# Patient Record
Sex: Female | Born: 1955
Health system: Southern US, Community
[De-identification: ages and names within clinical notes are randomized; demographics above are authoritative.]

## PROBLEM LIST (undated history)

## (undated) DIAGNOSIS — E785 Hyperlipidemia, unspecified: Secondary | ICD-10-CM

## (undated) DIAGNOSIS — G4730 Sleep apnea, unspecified: Secondary | ICD-10-CM

## (undated) DIAGNOSIS — G4733 Obstructive sleep apnea (adult) (pediatric): Secondary | ICD-10-CM

## (undated) DIAGNOSIS — E039 Hypothyroidism, unspecified: Secondary | ICD-10-CM

## (undated) DIAGNOSIS — E119 Type 2 diabetes mellitus without complications: Secondary | ICD-10-CM

## (undated) DIAGNOSIS — F32A Depression, unspecified: Secondary | ICD-10-CM

## (undated) DIAGNOSIS — I10 Essential (primary) hypertension: Secondary | ICD-10-CM

## (undated) DIAGNOSIS — Z8669 Personal history of other diseases of the nervous system and sense organs: Secondary | ICD-10-CM

## (undated) HISTORY — DX: Obstructive sleep apnea (adult) (pediatric): G47.33

## (undated) HISTORY — DX: Hyperlipidemia, unspecified: E78.5

## (undated) HISTORY — PX: FOOT SURGERY: SHX648

## (undated) HISTORY — DX: Depression, unspecified: F32.A

## (undated) HISTORY — DX: Type 2 diabetes mellitus without complications: E11.9

## (undated) HISTORY — PX: BREAST SURGERY: SHX581

## (undated) HISTORY — DX: Personal history of other diseases of the nervous system and sense organs: Z86.69

## (undated) HISTORY — PX: OTHER SURGICAL HISTORY: SHX169

## (undated) HISTORY — DX: Essential (primary) hypertension: I10

## (undated) HISTORY — DX: Hypothyroidism, unspecified: E03.9

## (undated) HISTORY — PX: TONSILLECTOMY AND ADENOIDECTOMY: SUR1326

## (undated) HISTORY — DX: Sleep apnea, unspecified: G47.30

## (undated) HISTORY — PX: ABDOMINAL HYSTERECTOMY: SHX81

---

## 2015-07-05 DIAGNOSIS — E785 Hyperlipidemia, unspecified: Secondary | ICD-10-CM | POA: Insufficient documentation

## 2015-07-05 DIAGNOSIS — E1169 Type 2 diabetes mellitus with other specified complication: Secondary | ICD-10-CM | POA: Insufficient documentation

## 2016-06-26 DIAGNOSIS — F320 Major depressive disorder, single episode, mild: Secondary | ICD-10-CM | POA: Insufficient documentation

## 2016-06-26 DIAGNOSIS — F32A Depression, unspecified: Secondary | ICD-10-CM | POA: Insufficient documentation

## 2019-11-29 ENCOUNTER — Ambulatory Visit: Payer: Self-pay

## 2020-03-18 ENCOUNTER — Telehealth: Payer: Self-pay | Admitting: General Practice

## 2020-03-18 NOTE — Telephone Encounter (Signed)
See staff msg.

## 2020-03-18 NOTE — Telephone Encounter (Signed)
Tonya Greer called stated that her sister [name] who is a patient of Dr.Scott spoke with Dr.Scott about taking her as anew patient

## 2020-03-18 NOTE — Telephone Encounter (Signed)
ok 

## 2020-03-25 NOTE — Telephone Encounter (Signed)
Pt wants husband to be new pt as well. States that sister was supposed to include him as well?

## 2020-03-27 NOTE — Telephone Encounter (Signed)
ok 

## 2020-03-27 NOTE — Telephone Encounter (Signed)
Ok

## 2020-03-29 NOTE — Telephone Encounter (Signed)
LMTCB and schedule new pt appt for spouse

## 2020-07-07 ENCOUNTER — Encounter: Payer: Self-pay | Admitting: Internal Medicine

## 2020-07-07 ENCOUNTER — Telehealth: Payer: Self-pay | Admitting: Internal Medicine

## 2020-07-07 ENCOUNTER — Other Ambulatory Visit: Payer: Self-pay

## 2020-07-07 ENCOUNTER — Telehealth (INDEPENDENT_AMBULATORY_CARE_PROVIDER_SITE_OTHER): Payer: Medicare HMO | Admitting: Internal Medicine

## 2020-07-07 DIAGNOSIS — E039 Hypothyroidism, unspecified: Secondary | ICD-10-CM | POA: Diagnosis not present

## 2020-07-07 DIAGNOSIS — R252 Cramp and spasm: Secondary | ICD-10-CM

## 2020-07-07 DIAGNOSIS — F329 Major depressive disorder, single episode, unspecified: Secondary | ICD-10-CM | POA: Diagnosis not present

## 2020-07-07 DIAGNOSIS — E785 Hyperlipidemia, unspecified: Secondary | ICD-10-CM

## 2020-07-07 DIAGNOSIS — F32A Depression, unspecified: Secondary | ICD-10-CM

## 2020-07-07 DIAGNOSIS — R0602 Shortness of breath: Secondary | ICD-10-CM

## 2020-07-07 DIAGNOSIS — E1169 Type 2 diabetes mellitus with other specified complication: Secondary | ICD-10-CM | POA: Diagnosis not present

## 2020-07-07 DIAGNOSIS — G4733 Obstructive sleep apnea (adult) (pediatric): Secondary | ICD-10-CM

## 2020-07-07 MED ORDER — METFORMIN HCL 500 MG PO TABS
500.0000 mg | ORAL_TABLET | Freq: Every day | ORAL | 0 refills | Status: DC
Start: 2020-07-07 — End: 2020-09-29

## 2020-07-07 MED ORDER — SIMVASTATIN 20 MG PO TABS
20.0000 mg | ORAL_TABLET | Freq: Every day | ORAL | 0 refills | Status: DC
Start: 2020-07-07 — End: 2020-08-04

## 2020-07-07 MED ORDER — SERTRALINE HCL 50 MG PO TABS
50.0000 mg | ORAL_TABLET | Freq: Two times a day (BID) | ORAL | 0 refills | Status: DC
Start: 2020-07-07 — End: 2020-09-29

## 2020-07-07 MED ORDER — LISINOPRIL-HYDROCHLOROTHIAZIDE 20-25 MG PO TABS
1.0000 | ORAL_TABLET | Freq: Every day | ORAL | 0 refills | Status: DC
Start: 2020-07-07 — End: 2020-08-20

## 2020-07-07 NOTE — Progress Notes (Deleted)
Patient ID: Tonya Greer, female   DOB: [birth year], 64 y.o.   MRN: [id number]   Subjective:    Patient ID: Tonya Greer, female    DOB: Feb 08, 1956, 64 y.o.   MRN: [id number]  HPI This visit occurred during the SARS-CoV-2 public health emergency.  Safety protocols were in place, including screening questions prior to the visit, additional usage of staff PPE, and extensive cleaning of exam room while observing appropriate contact time as indicated for disinfecting solutions.  Patient here for a  No past medical history on file. *** The histories are not reviewed yet. Please review them in the "History" navigator section and refresh this [place]. No family history on file. Social History   Socioeconomic History  . Marital status: Unknown    Spouse name: Not on file  . Number of children: Not on file  . Years of education: Not on file  . Highest education level: Not on file  Occupational History  . Not on file  Tobacco Use  . Smoking status: Not on file  Substance and Sexual Activity  . Alcohol use: Not on file  . Drug use: Not on file  . Sexual activity: Not on file  Other Topics Concern  . Not on file  Social History Narrative  . Not on file   Social Determinants of Health   Financial Resource Strain:   . Difficulty of Paying Living Expenses: Not on file  Food Insecurity:   . Worried About Charity fundraiser in the Last Year: Not on file  . Ran Out of Food in the Last Year: Not on file  Transportation Needs:   . Lack of Transportation (Medical): Not on file  . Lack of Transportation (Non-Medical): Not on file  Physical Activity:   . Days of Exercise per Week: Not on file  . Minutes of Exercise per Session: Not on file  Stress:   . Feeling of Stress : Not on file  Social Connections:   . Frequency of Communication with Friends and Family: Not on file  . Frequency of Social Gatherings with Friends and Family: Not on file  . Attends Religious Services: Not on file    . Active Member of Clubs or Organizations: Not on file  . Attends Archivist Meetings: Not on file  . Marital Status: Not on file     Review of Systems     Objective:    Physical Exam  Ht 5\' 2"  (1.575 m)   Wt 201 lb (91.2 kg)   BMI 36.76 kg/m  Wt Readings from Last 3 Encounters:  07/07/20 201 lb (91.2 kg)     No results found for: WBC, HGB, HCT, PLT, GLUCOSE, CHOL, TRIG, HDL, LDLDIRECT, LDLCALC, ALT, AST, NA, K, CL, CREATININE, BUN, CO2, TSH, PSA, INR, GLUF, HGBA1C, MICROALBUR  Patient was never admitted.     Assessment & Plan:   Problem List Items Addressed This Visit    None       Einar Pheasant, MD

## 2020-07-07 NOTE — Telephone Encounter (Signed)
Had an establish care appt today.  I have sent in her medications.  The only one I did not send in is synthroid.  She was asking me about generic synthroid.  Does she want to try the generic and see how she does?  (cost was an issue).  Just let me know.  Also, she will need to come in for fasting lab.  Can schedule here when able.  I was not sure of time frame of when she could come in.  I will put in order for labs, just needs lab appt time.

## 2020-07-07 NOTE — Telephone Encounter (Signed)
LMTCB

## 2020-07-08 ENCOUNTER — Encounter: Payer: Self-pay | Admitting: Internal Medicine

## 2020-07-09 ENCOUNTER — Other Ambulatory Visit: Payer: Self-pay

## 2020-07-09 LAB — HM MAMMOGRAPHY

## 2020-07-09 MED ORDER — FLOVENT HFA 110 MCG/ACT IN AERO: 1.0000 | INHALATION_SPRAY | Freq: Two times a day (BID) | RESPIRATORY_TRACT | 0 refills | Status: DC

## 2020-07-09 NOTE — Telephone Encounter (Signed)
Rx sent in

## 2020-07-09 NOTE — Telephone Encounter (Signed)
Patient called in about MyChart message. Larena Glassman is aware and is taking care of it.

## 2020-07-09 NOTE — Telephone Encounter (Signed)
Ok to send in rx for inhaler.

## 2020-07-15 NOTE — Telephone Encounter (Signed)
Orders placed for labs

## 2020-07-15 NOTE — Addendum Note (Signed)
Addended by: Alisa Graff on: 07/15/2020 01:41 PM   Modules accepted: Orders

## 2020-07-15 NOTE — Telephone Encounter (Signed)
Patient is okay to try the generic. She has enough to last her through October. I have scheduled her for fasting labs on 07/28/20 and advised that we would send in medication after labs come back. Do you just want to order routine labs for her. I do not mind ordering just wanted to confirm with you.

## 2020-07-15 NOTE — Telephone Encounter (Signed)
Noted  

## 2020-07-18 ENCOUNTER — Telehealth: Payer: Self-pay | Admitting: Internal Medicine

## 2020-07-18 ENCOUNTER — Encounter: Payer: Self-pay | Admitting: Internal Medicine

## 2020-07-18 DIAGNOSIS — R0602 Shortness of breath: Secondary | ICD-10-CM | POA: Insufficient documentation

## 2020-07-18 DIAGNOSIS — E039 Hypothyroidism, unspecified: Secondary | ICD-10-CM | POA: Insufficient documentation

## 2020-07-18 DIAGNOSIS — G4733 Obstructive sleep apnea (adult) (pediatric): Secondary | ICD-10-CM | POA: Insufficient documentation

## 2020-07-18 DIAGNOSIS — R252 Cramp and spasm: Secondary | ICD-10-CM | POA: Insufficient documentation

## 2020-07-18 NOTE — Telephone Encounter (Signed)
Please schedule fasting labs in the next 1-2 weeks (when she can come into the office).  Traveled to [place] recently.

## 2020-07-18 NOTE — Assessment & Plan Note (Signed)
On zoloft and appears to be doing well.  Follow.

## 2020-07-18 NOTE — Assessment & Plan Note (Addendum)
Last a1c 6.6 - 03/2020.  Discussed low carb diet and exercise.  Follow met b and a1c.  On metformin.

## 2020-07-18 NOTE — Progress Notes (Signed)
Patient ID: Tonya Greer, female   DOB: Mar 27, 1956, 64 y.o.   MRN: [id number]   Virtual Visit via video Note  This visit type was conducted due to national recommendations for restrictions regarding the COVID-19 pandemic (e.g. social distancing).  This format is felt to be most appropriate for this patient at this time.  All issues noted in this document were discussed and addressed.  No physical exam was performed (except for noted visual exam findings with Video Visits).   I connected with Tonya Greer by a video enabled telemedicine application and verified that I am speaking with the correct person using two identifiers. Location patient: home Location provider: work Persons participating in the virtual visit: patient, provider  The limitations, risks, security and privacy concerns of performing an evaluation and management service by video and the availability of in person appointments have been discussed.  It has also been discussed with the patient that there may be a patient responsible charge related to this service. The patient expressed understanding and agreed to proceed.   Reason for visit: establish care appt  HPI: Here to establish care.  Moved to [place] within the last year.  Has a history of depression, hypothyroidism, sleep apnea and diabetes.  Tries to stay active.  Has seen cardiologist previously.  Mother had heart disease.  Had elevated calcium score - per note. Had previous cardiac w/up and evaluation.  S/p holter with no concerning rhythm issue.  No chest pain.  Does report some sob with exertion.  Has sleep apnea.  Request reevaluation.  No acid reflux reported.  No abdominal pain.  Bowels moving.  Recently saw Tonya Greer for her thyroid.  Had labs.  Reviewed.  Has known diabetes.  Discussed low carb diet and exercise. a1c 6.6 -  03/2020.   States she has gained 20 pounds in the last two years.  Had two foot surgeries.  Recently traveled to [place].  On zoloft.  History of  depression.  Appears to be doing well on this medication.  Reports some leg cramps.  Also has a history of CTS - bilateral.  States has mammogram and bone density scheduled.  Last colonoscopy 08/2019.     ROS: See pertinent positives and negatives per HPI.  Past Medical History:  Diagnosis Date  . Depression   . Diabetes mellitus without complication ([place])   . History of carpal tunnel syndrome   . Hyperlipidemia   . Hypertension   . Hypothyroidism   . OSA (obstructive sleep apnea)     Past Surgical History:  Procedure Laterality Date  . ABDOMINAL HYSTERECTOMY     ovaries left in place  . brreast biopsy    . FOOT SURGERY    . TONSILLECTOMY AND ADENOIDECTOMY      Family History  Problem Relation Age of Onset  . Heart disease Mother     SOCIAL HX: reviewed.    Current Outpatient Medications:  .  lisinopril-hydrochlorothiazide (ZESTORETIC) 20-25 MG tablet, Take 1 tablet by mouth daily., Disp: 90 tablet, Rfl: 0 .  metFORMIN (GLUCOPHAGE) 500 MG tablet, Take 1 tablet (500 mg total) by mouth daily., Disp: 90 tablet, Rfl: 0 .  sertraline (ZOLOFT) 50 MG tablet, Take 1 tablet (50 mg total) by mouth in the morning and at bedtime., Disp: 180 tablet, Rfl: 0 .  simvastatin (ZOCOR) 20 MG tablet, Take 1 tablet (20 mg total) by mouth at bedtime., Disp: 90 tablet, Rfl: 0 .  aspirin 81 MG chewable tablet, Chew 1 tablet by  mouth daily., Disp: , Rfl:  .  FLOVENT HFA 110 MCG/ACT inhaler, Inhale 1 puff into the lungs 2 (two) times daily., Disp: 1 each, Rfl: 0 .  SYNTHROID 100 MCG tablet, Take 100 mcg by mouth daily., Disp: , Rfl:   EXAM:  GENERAL: alert, oriented, appears well and in no acute distress  HEENT: atraumatic, conjunttiva clear, no obvious abnormalities on inspection of external nose and ears  NECK: normal movements of the head and neck  LUNGS: on inspection no signs of respiratory distress, breathing rate appears normal, no obvious gross SOB, gasping or wheezing  CV: no  obvious cyanosis  PSYCH/NEURO: pleasant and cooperative, no obvious depression or anxiety, speech and thought processing grossly intact  ASSESSMENT AND PLAN:  Discussed the following assessment and plan:  DM type 2 with diabetic dyslipidemia (HCC) Last a1c 6.6 - 03/2020.  Discussed low carb diet and exercise.  Follow met b and a1c.  On metformin.    Chronic depression On zoloft and appears to be doing well.  Follow.    Hypothyroid On synthroid.  States synthroid is expensive.  Was questioning if she could try generic.  Discussed would need to monitor levels.  Follow tsh.  Will notify me when needs.  Has seen Tonya  Gabriel Greer.   OSA (obstructive sleep apnea) Uses cpap.  Request reevaluation.    SOB (shortness of breath) Reports some sob with exertion.  Has seen cardiology and had previous cardiac w/up.  Uses flovent.  No increased cough or congestion. Known sleep apnea.  Has gained 20 pounds in the last two years. Discussed diet and exercise.  Pulmonary referral as outlined.  Follow.    Leg cramps Did report having issues with leg cramps.  Stretches.  Check electrolytes, magnesium, etc.     Orders Placed This Encounter  Procedures  . CBC with Differential/Platelet    Standing Status:   Future    Standing Expiration Date:   07/18/2021  . Hepatic function panel    Standing Status:   Future    Standing Expiration Date:   07/18/2021  . Hemoglobin A1c    Standing Status:   Future    Standing Expiration Date:   07/18/2021  . Lipid panel    Standing Status:   Future    Standing Expiration Date:   07/18/2021  . TSH    Standing Status:   Future    Standing Expiration Date:   07/18/2021  . Basic metabolic panel    Standing Status:   Future    Standing Expiration Date:   07/18/2021  . Magnesium    Standing Status:   Future    Standing Expiration Date:   07/18/2021  . Ambulatory referral to Pulmonology    Referral Priority:   Routine    Referral Type:   Consultation    Referral Reason:    Specialty Services Required    Requested Specialty:   Pulmonary Disease    Number of Visits Requested:   1    Meds ordered this encounter  Medications  . metFORMIN (GLUCOPHAGE) 500 MG tablet    Sig: Take 1 tablet (500 mg total) by mouth daily.    Dispense:  90 tablet    Refill:  0  . sertraline (ZOLOFT) 50 MG tablet    Sig: Take 1 tablet (50 mg total) by mouth in the morning and at bedtime.    Dispense:  180 tablet    Refill:  0  . lisinopril-hydrochlorothiazide (ZESTORETIC) 20-25 MG tablet  Sig: Take 1 tablet by mouth daily.    Dispense:  90 tablet    Refill:  0  . simvastatin (ZOCOR) 20 MG tablet    Sig: Take 1 tablet (20 mg total) by mouth at bedtime.    Dispense:  90 tablet    Refill:  0     I discussed the assessment and treatment plan with the patient. The patient was provided an opportunity to ask questions and all were answered. The patient agreed with the plan and demonstrated an understanding of the instructions.   The patient was advised to call back or seek an in-person evaluation if the symptoms worsen or if the condition fails to improve as anticipated.   Einar Pheasant, MD

## 2020-07-18 NOTE — Assessment & Plan Note (Signed)
Uses cpap.  Request reevaluation.

## 2020-07-18 NOTE — Assessment & Plan Note (Addendum)
On synthroid.  States synthroid is expensive.  Was questioning if she could try generic.  Discussed would need to monitor levels.  Follow tsh.  Will notify me when needs.  Has seen Dr  Gabriel Carina.

## 2020-07-18 NOTE — Assessment & Plan Note (Signed)
Did report having issues with leg cramps.  Stretches.  Check electrolytes, magnesium, etc.

## 2020-07-18 NOTE — Assessment & Plan Note (Signed)
Reports some sob with exertion.  Has seen cardiology and had previous cardiac w/up.  Uses flovent.  No increased cough or congestion. Known sleep apnea.  Has gained 20 pounds in the last two years. Discussed diet and exercise.  Pulmonary referral as outlined.  Follow.

## 2020-07-20 NOTE — Telephone Encounter (Signed)
Lab appt already scheduled

## 2020-07-28 ENCOUNTER — Other Ambulatory Visit: Payer: Medicare HMO

## 2020-07-30 ENCOUNTER — Other Ambulatory Visit: Payer: Self-pay

## 2020-07-30 ENCOUNTER — Other Ambulatory Visit (INDEPENDENT_AMBULATORY_CARE_PROVIDER_SITE_OTHER): Payer: Medicare HMO

## 2020-07-30 DIAGNOSIS — E1169 Type 2 diabetes mellitus with other specified complication: Secondary | ICD-10-CM | POA: Diagnosis not present

## 2020-07-30 DIAGNOSIS — E785 Hyperlipidemia, unspecified: Secondary | ICD-10-CM

## 2020-07-30 DIAGNOSIS — E039 Hypothyroidism, unspecified: Secondary | ICD-10-CM

## 2020-07-30 DIAGNOSIS — Z23 Encounter for immunization: Secondary | ICD-10-CM | POA: Diagnosis not present

## 2020-07-30 DIAGNOSIS — R252 Cramp and spasm: Secondary | ICD-10-CM | POA: Diagnosis not present

## 2020-07-30 LAB — LIPID PANEL
Cholesterol: 187 mg/dL (ref 0–200)
HDL: 56.7 mg/dL (ref >39.00)
LDL Cholesterol: 102 mg/dL — ABNORMAL HIGH (ref 0–99)
NonHDL: 130.49
Total CHOL/HDL Ratio: 3
Triglycerides: 143 mg/dL (ref 0.0–149.0)
VLDL: 28.6 mg/dL (ref 0.0–40.0)

## 2020-07-30 LAB — TSH: TSH: 6.2 u[IU]/mL — ABNORMAL HIGH (ref 0.35–4.50)

## 2020-07-30 LAB — CBC WITH DIFFERENTIAL/PLATELET
Basophils Absolute: 0.1 10*3/uL (ref 0.0–0.1)
Basophils Relative: 0.9 % (ref 0.0–3.0)
Eosinophils Absolute: 0.3 10*3/uL (ref 0.0–0.7)
Eosinophils Relative: 3.4 % (ref 0.0–5.0)
HCT: 37.7 % (ref 36.0–46.0)
Hemoglobin: 12.4 g/dL (ref 12.0–15.0)
Lymphocytes Relative: 17.6 % (ref 12.0–46.0)
Lymphs Abs: 1.4 10*3/uL (ref 0.7–4.0)
MCHC: 33 g/dL (ref 30.0–36.0)
MCV: 95 fl (ref 78.0–100.0)
Monocytes Absolute: 0.4 10*3/uL (ref 0.1–1.0)
Monocytes Relative: 5.2 % (ref 3.0–12.0)
Neutro Abs: 5.8 10*3/uL (ref 1.4–7.7)
Neutrophils Relative %: 72.9 % (ref 43.0–77.0)
Platelets: 188 10*3/uL (ref 150.0–400.0)
RBC: 3.96 Mil/uL (ref 3.87–5.11)
RDW: 14.7 % (ref 11.5–15.5)
WBC: 7.9 10*3/uL (ref 4.0–10.5)

## 2020-07-30 LAB — BASIC METABOLIC PANEL
BUN: 18 mg/dL (ref 6–23)
CO2: 29 mEq/L (ref 19–32)
Calcium: 9 mg/dL (ref 8.4–10.5)
Chloride: 103 mEq/L (ref 96–112)
Creatinine, Ser: 1.14 mg/dL (ref 0.40–1.20)
GFR: 50.29 mL/min — ABNORMAL LOW (ref >60.00)
Glucose, Bld: 102 mg/dL — ABNORMAL HIGH (ref 70–99)
Potassium: 3.8 mEq/L (ref 3.5–5.1)
Sodium: 140 mEq/L (ref 135–145)

## 2020-07-30 LAB — HEPATIC FUNCTION PANEL
ALT: 16 U/L (ref 0–35)
AST: 16 U/L (ref 0–37)
Albumin: 4.5 g/dL (ref 3.5–5.2)
Alkaline Phosphatase: 55 U/L (ref 39–117)
Bilirubin, Direct: 0.1 mg/dL (ref 0.0–0.3)
Total Bilirubin: 0.5 mg/dL (ref 0.2–1.2)
Total Protein: 7 g/dL (ref 6.0–8.3)

## 2020-07-30 LAB — MAGNESIUM: Magnesium: 2 mg/dL (ref 1.5–2.5)

## 2020-07-30 LAB — HEMOGLOBIN A1C: Hgb A1c MFr Bld: 6.7 % — ABNORMAL HIGH (ref 4.6–6.5)

## 2020-08-04 ENCOUNTER — Other Ambulatory Visit: Payer: Self-pay

## 2020-08-04 MED ORDER — ROSUVASTATIN CALCIUM 20 MG PO TABS: 20.0000 mg | ORAL_TABLET | Freq: Every day | ORAL | 1 refills | Status: DC

## 2020-08-20 ENCOUNTER — Other Ambulatory Visit: Payer: Self-pay | Admitting: Internal Medicine

## 2020-09-08 ENCOUNTER — Encounter: Payer: Self-pay | Admitting: Internal Medicine

## 2020-09-08 ENCOUNTER — Other Ambulatory Visit: Payer: Self-pay | Admitting: Internal Medicine

## 2020-09-08 MED ORDER — SYNTHROID 100 MCG PO TABS
100.0000 ug | ORAL_TABLET | Freq: Every day | ORAL | 1 refills | Status: DC
Start: 2020-09-08 — End: 2020-09-30

## 2020-09-08 NOTE — Telephone Encounter (Signed)
rx sent in for synthroid 179mcg #30 with 1 refill.

## 2020-09-08 NOTE — Telephone Encounter (Signed)
Last TSH 07/30/20 ok to reorder historical provider levothyroxine 100 mcg.

## 2020-09-27 ENCOUNTER — Encounter: Payer: Medicare HMO | Admitting: Internal Medicine

## 2020-09-29 ENCOUNTER — Other Ambulatory Visit: Payer: Self-pay | Admitting: Internal Medicine

## 2020-09-29 ENCOUNTER — Encounter: Payer: Self-pay | Admitting: Internal Medicine

## 2020-09-30 ENCOUNTER — Other Ambulatory Visit: Payer: Self-pay

## 2020-09-30 MED ORDER — SERTRALINE HCL 50 MG PO TABS
50.0000 mg | ORAL_TABLET | Freq: Two times a day (BID) | ORAL | 1 refills | Status: DC
Start: 2020-09-30 — End: 2020-12-21

## 2020-09-30 MED ORDER — SYNTHROID 100 MCG PO TABS: 100.0000 ug | ORAL_TABLET | Freq: Every day | ORAL | 1 refills | Status: DC

## 2020-09-30 MED ORDER — METFORMIN HCL 500 MG PO TABS
500.0000 mg | ORAL_TABLET | Freq: Every day | ORAL | 1 refills | Status: DC
Start: 2020-09-30 — End: 2020-12-29

## 2020-09-30 MED ORDER — ROSUVASTATIN CALCIUM 20 MG PO TABS
20.0000 mg | ORAL_TABLET | Freq: Every day | ORAL | 1 refills | Status: DC
Start: 2020-09-30 — End: 2020-10-04

## 2020-10-02 ENCOUNTER — Other Ambulatory Visit: Payer: Self-pay | Admitting: Internal Medicine

## 2020-10-18 DIAGNOSIS — C50412 Malignant neoplasm of upper-outer quadrant of left female breast: Secondary | ICD-10-CM | POA: Diagnosis not present

## 2020-10-18 DIAGNOSIS — Z170 Estrogen receptor positive status [ER+]: Secondary | ICD-10-CM | POA: Diagnosis not present

## 2020-10-25 DIAGNOSIS — C50412 Malignant neoplasm of upper-outer quadrant of left female breast: Secondary | ICD-10-CM | POA: Diagnosis not present

## 2020-10-26 DIAGNOSIS — C50412 Malignant neoplasm of upper-outer quadrant of left female breast: Secondary | ICD-10-CM | POA: Diagnosis not present

## 2020-10-27 DIAGNOSIS — C50412 Malignant neoplasm of upper-outer quadrant of left female breast: Secondary | ICD-10-CM | POA: Diagnosis not present

## 2020-10-28 DIAGNOSIS — C50412 Malignant neoplasm of upper-outer quadrant of left female breast: Secondary | ICD-10-CM | POA: Diagnosis not present

## 2020-10-29 DIAGNOSIS — C50412 Malignant neoplasm of upper-outer quadrant of left female breast: Secondary | ICD-10-CM | POA: Diagnosis not present

## 2020-11-02 DIAGNOSIS — Z170 Estrogen receptor positive status [ER+]: Secondary | ICD-10-CM | POA: Diagnosis not present

## 2020-11-02 DIAGNOSIS — C50412 Malignant neoplasm of upper-outer quadrant of left female breast: Secondary | ICD-10-CM | POA: Diagnosis not present

## 2020-11-09 DIAGNOSIS — Z170 Estrogen receptor positive status [ER+]: Secondary | ICD-10-CM | POA: Diagnosis not present

## 2020-11-09 DIAGNOSIS — C50412 Malignant neoplasm of upper-outer quadrant of left female breast: Secondary | ICD-10-CM | POA: Diagnosis not present

## 2020-11-11 ENCOUNTER — Institutional Professional Consult (permissible substitution): Payer: Medicare HMO | Admitting: Pulmonary Disease

## 2020-11-15 ENCOUNTER — Encounter: Payer: Self-pay | Admitting: Internal Medicine

## 2020-11-18 ENCOUNTER — Encounter: Payer: Self-pay | Admitting: Internal Medicine

## 2020-11-19 ENCOUNTER — Other Ambulatory Visit: Payer: Self-pay

## 2020-11-19 ENCOUNTER — Other Ambulatory Visit: Payer: Self-pay | Admitting: Internal Medicine

## 2020-11-19 MED ORDER — LISINOPRIL-HYDROCHLOROTHIAZIDE 20-25 MG PO TABS: 1.0000 | ORAL_TABLET | Freq: Every day | ORAL | 1 refills | Status: DC

## 2020-11-24 ENCOUNTER — Institutional Professional Consult (permissible substitution): Payer: Medicare Other | Admitting: Primary Care

## 2020-12-02 DIAGNOSIS — C50912 Malignant neoplasm of unspecified site of left female breast: Secondary | ICD-10-CM | POA: Diagnosis not present

## 2020-12-10 ENCOUNTER — Encounter: Payer: Medicare HMO | Admitting: Internal Medicine

## 2020-12-21 ENCOUNTER — Other Ambulatory Visit: Payer: Self-pay | Admitting: Internal Medicine

## 2020-12-22 DIAGNOSIS — I10 Essential (primary) hypertension: Secondary | ICD-10-CM | POA: Diagnosis not present

## 2020-12-22 DIAGNOSIS — E119 Type 2 diabetes mellitus without complications: Secondary | ICD-10-CM | POA: Diagnosis not present

## 2020-12-22 DIAGNOSIS — Z170 Estrogen receptor positive status [ER+]: Secondary | ICD-10-CM | POA: Diagnosis not present

## 2020-12-22 DIAGNOSIS — I240 Acute coronary thrombosis not resulting in myocardial infarction: Secondary | ICD-10-CM | POA: Diagnosis not present

## 2020-12-28 ENCOUNTER — Telehealth: Payer: Self-pay | Admitting: *Deleted

## 2020-12-28 DIAGNOSIS — E039 Hypothyroidism, unspecified: Secondary | ICD-10-CM

## 2020-12-28 DIAGNOSIS — E1169 Type 2 diabetes mellitus with other specified complication: Secondary | ICD-10-CM

## 2020-12-28 DIAGNOSIS — E785 Hyperlipidemia, unspecified: Secondary | ICD-10-CM

## 2020-12-28 NOTE — Telephone Encounter (Signed)
Order placed for labs.

## 2020-12-28 NOTE — Telephone Encounter (Signed)
Please place future orders for lab appt.  

## 2020-12-29 ENCOUNTER — Other Ambulatory Visit: Payer: Self-pay | Admitting: Internal Medicine

## 2020-12-30 ENCOUNTER — Other Ambulatory Visit: Payer: Self-pay

## 2020-12-30 ENCOUNTER — Other Ambulatory Visit (INDEPENDENT_AMBULATORY_CARE_PROVIDER_SITE_OTHER): Payer: Medicare Other

## 2020-12-30 DIAGNOSIS — E785 Hyperlipidemia, unspecified: Secondary | ICD-10-CM

## 2020-12-30 DIAGNOSIS — E039 Hypothyroidism, unspecified: Secondary | ICD-10-CM

## 2020-12-30 DIAGNOSIS — E1169 Type 2 diabetes mellitus with other specified complication: Secondary | ICD-10-CM

## 2020-12-30 LAB — BASIC METABOLIC PANEL
BUN: 25 mg/dL — ABNORMAL HIGH (ref 6–23)
CO2: 27 mEq/L (ref 19–32)
Calcium: 9.8 mg/dL (ref 8.4–10.5)
Chloride: 102 mEq/L (ref 96–112)
Creatinine, Ser: 1.17 mg/dL (ref 0.40–1.20)
GFR: 48.87 mL/min — ABNORMAL LOW (ref >60.00)
Glucose, Bld: 98 mg/dL (ref 70–99)
Potassium: 3.7 mEq/L (ref 3.5–5.1)
Sodium: 140 mEq/L (ref 135–145)

## 2020-12-30 LAB — HEMOGLOBIN A1C: Hgb A1c MFr Bld: 6.6 % — ABNORMAL HIGH (ref 4.6–6.5)

## 2020-12-30 LAB — LIPID PANEL
Cholesterol: 123 mg/dL (ref 0–200)
HDL: 50.7 mg/dL (ref >39.00)
LDL Cholesterol: 49 mg/dL (ref 0–99)
NonHDL: 71.85
Total CHOL/HDL Ratio: 2
Triglycerides: 115 mg/dL (ref 0.0–149.0)
VLDL: 23 mg/dL (ref 0.0–40.0)

## 2020-12-30 LAB — HEPATIC FUNCTION PANEL
ALT: 28 U/L (ref 0–35)
AST: 24 U/L (ref 0–37)
Albumin: 4.4 g/dL (ref 3.5–5.2)
Alkaline Phosphatase: 56 U/L (ref 39–117)
Bilirubin, Direct: 0.1 mg/dL (ref 0.0–0.3)
Total Bilirubin: 0.6 mg/dL (ref 0.2–1.2)
Total Protein: 7.2 g/dL (ref 6.0–8.3)

## 2020-12-30 LAB — TSH: TSH: 1.53 u[IU]/mL (ref 0.35–4.50)

## 2021-01-03 ENCOUNTER — Encounter: Payer: Medicare Other | Admitting: Internal Medicine

## 2021-01-04 ENCOUNTER — Telehealth: Payer: Self-pay | Admitting: Pulmonary Disease

## 2021-01-04 NOTE — Telephone Encounter (Signed)
Spoke to patient and requested that she bring CPAP SD card to 01/05/2021 visit.

## 2021-01-05 ENCOUNTER — Encounter: Payer: Self-pay | Admitting: Pulmonary Disease

## 2021-01-05 ENCOUNTER — Other Ambulatory Visit: Payer: Self-pay

## 2021-01-05 ENCOUNTER — Ambulatory Visit (INDEPENDENT_AMBULATORY_CARE_PROVIDER_SITE_OTHER): Payer: Medicare Other | Admitting: Pulmonary Disease

## 2021-01-05 VITALS — BP 128/78 | HR 60 | Ht 62.0 in | Wt 198.2 lb

## 2021-01-05 DIAGNOSIS — G4733 Obstructive sleep apnea (adult) (pediatric): Secondary | ICD-10-CM | POA: Diagnosis not present

## 2021-01-05 NOTE — Patient Instructions (Signed)
Will arrange for new CPAP set up and new medical supply company  Please bring copies of your prior allergy tests, breathing tests, and chest xrays to our office for our records  Follow up in 5 months

## 2021-01-05 NOTE — Progress Notes (Addendum)
[facility]  Chief Complaint  Patient presents with  . Consult    Re establish with a sleep md, wants a new machine, greater than 5 years.    Constitutional:  BP 128/78 (BP Location: Left Arm, Patient Position: Sitting, Cuff Size: Large)   Pulse 60   Ht 5\' 2"  (1.575 m)   Wt 198 lb 3.2 oz (89.9 kg)   SpO2 98%   BMI 36.25 kg/m   Past Medical History:  Depression, DM, Carpal tunnel, HLD, HTN, Hypothyroidism, Lt breast cancer, CAD  Past Surgical History:  She  has a past surgical history that includes Abdominal hysterectomy; Foot surgery; Tonsillectomy and adenoidectomy; and brreast biopsy.  Brief Summary:  Tonya Greer is a 65 y.o. female with obstructive sleep apnea.      Subjective:   She is here with her husband.  She was living in [place].  She had sleep study there and started on CPAP.  Her current device is more than 66 yrs old, and starting to make noises.  She uses nasal pillow mask.  She tried an oral appliance before, but this wasn't comfortable.  She can usually fall asleep easily.  She sleeps through the night.  She feels okay in the morning.  She drinks a couple cups of coffee in the morning.  She will nap in the afternoon for about 1 to 1.5 hrs, but doesn't use CPAP then.  Not using anything to help sleep.  She denies sleep walking, sleep talking, bruxism, or nightmares.  There is no history of restless legs.  She denies sleep hallucinations, sleep paralysis, or cataplexy.  The Epworth score is 3 out of 24.  She was seen by an allergist in [place].  She gets intermittent episodes of cough.  This is associated with chest tightness and wheezing.  She will occasionally bring up clear sputum.  Not having sinus congestion, post nasal drip or reflux.  Uses flovent intermittently and this helps.  She doesn't have albuterol.    She gets funny sensations in her legs.  These happen when she sits down or lays down.  She fells  like her muscle fibers are randomly firing, and she can feel her muscle fibers twitching.  She does have back pain issues and has history of diabetes.    Physical Exam:   Appearance - well kempt   ENMT - no sinus tenderness, no oral exudate, no LAN, Mallampati 3 airway, no stridor  Respiratory - equal breath sounds bilaterally, no wheezing or rales  CV - s1s2 regular rate and rhythm, no murmurs  Ext - no clubbing, no edema  Skin - no rashes  Psych - normal mood and affect   Pulmonary Tests:    Sleep Tests:   PSG 02/17/15 >> AHI 10, SpO2 low 85.6%  CPAP 12/06/20 to 01/04/21 >> used on 30 of 30 nights with average 8 hrs 42 min.  Average AHI 3.1 with CPAP 8 cm H2O  Social History:  She  reports that she has never smoked. She has never used smokeless tobacco. She reports that she does not use drugs.  Family History:  Her family history includes Heart disease in her mother.    Discussion:  She has snoring, sleep disruption, apnea, and daytime sleepiness.  She has history of CAD, depression, hypertension, and diabetes.  Her BMI is > 35.  She has history of mild to moderate obstructive sleep apnea.  Assessment/Plan:   Obstructive sleep apnea. - she  is compliant with CPAP and reports benefit from therapy - she needs to establish with a new DME in [place] - her current device is more than 67 yrs old, not functioning properly, and not amenable to repair - will arrange for new CPAP machine at 8 cm H2O - advised that she might need to repeat home sleep study for insurance coverage prior to getting new machine  Cough variant asthma. - continue prn flovent - she will bring copy of her records from [place] - will then determine if she needs further assessment or adjustment to her inhaler regimen  Leg cramps. - her description is suggestive of intermittent fasciculations involving her lower legs - neuro exam unremarkable today - advised her to discuss with her PCP and then  determine whether she needs further neurology assessment  Palpitations. - explained that flovent is not a typical trigger for palpitations - advised her to f/u with PCP and cardiology if this persists/progresses  Coronary artery disease. - followed by Dr. Marney Setting with [facility]  Left breast cancer.  - followed by Dr. Leone Haven with [facility]  Obesity. - discussed how weight can impact sleep and risk for sleep disordered breathing - discussed options to assist with weight loss: combination of diet modification, cardiovascular and strength training exercises  Cardiovascular risk. - had an extensive discussion regarding the adverse health consequences related to untreated sleep disordered breathing - specifically discussed the risks for hypertension, coronary artery disease, cardiac dysrhythmias, cerebrovascular disease, and diabetes - lifestyle modification discussed  Safe driving practices. - discussed how sleep disruption can increase risk of accidents, particularly when driving - safe driving practices were discussed  Therapies for obstructive sleep apnea. - if the sleep study shows significant sleep apnea, then various therapies for treatment were reviewed: CPAP, oral appliance, and surgical interventions  Time Spent Involved in Patient Care on Day of Examination:  47 minutes  Follow up:  Patient Instructions  Will arrange for new CPAP set up and new medical supply company  Please bring copies of your prior allergy tests, breathing tests, and chest xrays to our office for our records  Follow up in 5 months   Medication List:   Allergies as of 01/05/2021      Reactions   Hydrocodone Itching   Oxycodone Itching      Medication List       Accurate as of January 05, 2021 10:15 AM. If you have any questions, ask your nurse or doctor.        aspirin 81 MG chewable tablet Chew 1 tablet by mouth daily.   Flovent HFA 110 MCG/ACT  inhaler Generic drug: fluticasone Inhale 1 puff into the lungs 2 (two) times daily.   lisinopril-hydrochlorothiazide 20-25 MG tablet Commonly known as: ZESTORETIC Take 1 tablet by mouth daily.   metFORMIN 500 MG tablet Commonly known as: GLUCOPHAGE TAKE 1 TABLET BY MOUTH EVERY DAY   rosuvastatin 20 MG tablet Commonly known as: CRESTOR TAKE 1 TABLET BY MOUTH EVERY DAY   sertraline 50 MG tablet Commonly known as: ZOLOFT TAKE 1 TABLET (50 MG TOTAL) BY MOUTH IN THE MORNING AND AT BEDTIME.   Synthroid 100 MCG tablet Generic drug: levothyroxine TAKE 1 TABLET BY MOUTH EVERY DAY       Signature:  Chesley Mires, MD [facility] Pager - [phone] 01/05/2021, 10:15 AM

## 2021-01-10 ENCOUNTER — Other Ambulatory Visit: Payer: Self-pay

## 2021-01-12 ENCOUNTER — Ambulatory Visit (INDEPENDENT_AMBULATORY_CARE_PROVIDER_SITE_OTHER): Payer: Medicare Other | Admitting: Internal Medicine

## 2021-01-12 ENCOUNTER — Other Ambulatory Visit: Payer: Self-pay

## 2021-01-12 DIAGNOSIS — R253 Fasciculation: Secondary | ICD-10-CM

## 2021-01-12 DIAGNOSIS — E039 Hypothyroidism, unspecified: Secondary | ICD-10-CM

## 2021-01-12 DIAGNOSIS — G4733 Obstructive sleep apnea (adult) (pediatric): Secondary | ICD-10-CM | POA: Diagnosis not present

## 2021-01-12 DIAGNOSIS — E1169 Type 2 diabetes mellitus with other specified complication: Secondary | ICD-10-CM

## 2021-01-12 DIAGNOSIS — F320 Major depressive disorder, single episode, mild: Secondary | ICD-10-CM

## 2021-01-12 DIAGNOSIS — R252 Cramp and spasm: Secondary | ICD-10-CM

## 2021-01-12 DIAGNOSIS — R002 Palpitations: Secondary | ICD-10-CM

## 2021-01-12 DIAGNOSIS — E785 Hyperlipidemia, unspecified: Secondary | ICD-10-CM

## 2021-01-12 DIAGNOSIS — F32A Depression, unspecified: Secondary | ICD-10-CM

## 2021-01-12 LAB — HM DIABETES FOOT EXAM

## 2021-01-12 NOTE — Progress Notes (Signed)
Patient ID: Tonya Greer, female   DOB: 06-Jun-1956, 65 y.o.   MRN: [id number]   Subjective:    Patient ID: Tonya Greer, female    DOB: May 24, 1956, 65 y.o.   MRN: [id number]  HPI This visit occurred during the SARS-CoV-2 public health emergency.  Safety protocols were in place, including screening questions prior to the visit, additional usage of staff PPE, and extensive cleaning of exam room while observing appropriate contact time as indicated for disinfecting solutions.  Patient here for scheduled physical.  Due to some medical concerns, visit was changed to a follow up.  She was recently diagnosed with breast cancer s/p left lumpcetomy in 08/2020 followed by XRT and currently on anastrozole.    Followed by [facility] oncology.  She was also recently evaluated by cardiology - palpitations and cardiac f/u.  Recommended CTA, monitor and echo.  Trying to stay active/become more active.  Started walking.  No chest pain.  Breathing stable. Uses flovent prn.  No acid reflux reported.  No abdominal pain.  Bowels moving.  Does report noticing muscle twitching. (question of cramping).  On further discussion, she appears to be describing fasciculations.  Started drinking more water. Monitoring EtOH intake.  Started magnesium. Uses CPAP.  Saw pulmonary recently to get new supplies.  Scheduled for eye exam next week.  Discussed labs.  Tolerating crestor.  Cholesterol improved.    Past Medical History:  Diagnosis Date  . Depression   . Diabetes mellitus without complication ([place])   . History of carpal tunnel syndrome   . Hyperlipidemia   . Hypertension   . Hypothyroidism   . OSA (obstructive sleep apnea)    Past Surgical History:  Procedure Laterality Date  . ABDOMINAL HYSTERECTOMY     ovaries left in place  . brreast biopsy    . FOOT SURGERY    . TONSILLECTOMY AND ADENOIDECTOMY     Family History  Problem Relation Age of Onset  . Heart disease Mother    Social History   Socioeconomic History  .  Marital status: Married    Spouse name: Not on file  . Number of children: 2  . Years of education: Not on file  . Highest education level: Not on file  Occupational History  . Not on file  Tobacco Use  . Smoking status: Never Smoker  . Smokeless tobacco: Never Used  Substance and Sexual Activity  . Alcohol use: Not on file  . Drug use: Never  . Sexual activity: Not on file  Other Topics Concern  . Not on file  Social History Narrative  . Not on file   Social Determinants of Health   Financial Resource Strain: Not on file  Food Insecurity: Not on file  Transportation Needs: Not on file  Physical Activity: Not on file  Stress: Not on file  Social Connections: Not on file    Outpatient Encounter Medications as of 01/12/2021  Medication Sig  . anastrozole (ARIMIDEX) 1 MG tablet Take 1 mg by mouth daily.  Marland Kitchen aspirin 81 MG chewable tablet Chew 1 tablet by mouth daily.  Marland Kitchen FLOVENT HFA 110 MCG/ACT inhaler Inhale 1 puff into the lungs 2 (two) times daily.  Marland Kitchen lisinopril-hydrochlorothiazide (ZESTORETIC) 20-25 MG tablet Take 1 tablet by mouth daily.  . metFORMIN (GLUCOPHAGE) 500 MG tablet TAKE 1 TABLET BY MOUTH EVERY DAY  . rosuvastatin (CRESTOR) 20 MG tablet TAKE 1 TABLET BY MOUTH EVERY DAY  . sertraline (ZOLOFT) 50 MG tablet TAKE 1 TABLET (50 MG TOTAL)  BY MOUTH IN THE MORNING AND AT BEDTIME.  . SYNTHROID 100 MCG tablet TAKE 1 TABLET BY MOUTH EVERY DAY   No facility-administered encounter medications on file as of 01/12/2021.    Review of Systems  Constitutional: Negative for appetite change and unexpected weight change.  HENT: Negative for congestion and sinus pressure.   Respiratory: Negative for cough, chest tightness and shortness of breath.   Cardiovascular: Negative for chest pain and leg swelling.  Gastrointestinal: Negative for abdominal pain, diarrhea, nausea and vomiting.  Genitourinary: Negative for difficulty urinating and dysuria.  Skin: Negative for color change and  rash.  Neurological: Negative for dizziness, light-headedness and headaches.  Psychiatric/Behavioral: Negative for agitation and dysphoric mood.       Objective:    Physical Exam Vitals reviewed.  Constitutional:      General: She is not in acute distress.    Appearance: Normal appearance.  HENT:     Head: Normocephalic and atraumatic.     Right Ear: External ear normal.     Left Ear: External ear normal.  Eyes:     General: No scleral icterus.       Right eye: No discharge.        Left eye: No discharge.     Conjunctiva/sclera: Conjunctivae normal.  Neck:     Thyroid: No thyromegaly.  Cardiovascular:     Rate and Rhythm: Normal rate and regular rhythm.     Pulses: Normal pulses.  Pulmonary:     Effort: No respiratory distress.     Breath sounds: Normal breath sounds. No wheezing.  Abdominal:     General: Bowel sounds are normal.     Palpations: Abdomen is soft.     Tenderness: There is no abdominal tenderness.  Musculoskeletal:        General: No swelling or tenderness.     Cervical back: Neck supple. No tenderness.  Lymphadenopathy:     Cervical: No cervical adenopathy.  Skin:    Findings: No erythema or rash.  Neurological:     Mental Status: She is alert.     Comments: Sensation intact to pin prick and light touch (monofilament).   Psychiatric:        Mood and Affect: Mood normal.        Behavior: Behavior normal.     BP 120/74   Pulse 69   Temp (!) 97.1 F (36.2 C) (Temporal)   Resp 16   Ht 5' 2"  (1.575 m)   Wt 199 lb 3.2 oz (90.4 kg)   SpO2 99%   BMI 36.43 kg/m  Wt Readings from Last 3 Encounters:  01/12/21 199 lb 3.2 oz (90.4 kg)  01/05/21 198 lb 3.2 oz (89.9 kg)  07/07/20 201 lb (91.2 kg)     Lab Results  Component Value Date   WBC 7.9 07/30/2020   HGB 12.4 07/30/2020   HCT 37.7 07/30/2020   PLT 188.0 07/30/2020   GLUCOSE 98 12/30/2020   CHOL 123 12/30/2020   TRIG 115.0 12/30/2020   HDL 50.70 12/30/2020   LDLCALC 49 12/30/2020    ALT 28 12/30/2020   AST 24 12/30/2020   NA 140 12/30/2020   K 3.7 12/30/2020   CL 102 12/30/2020   CREATININE 1.17 12/30/2020   BUN 25 (H) 12/30/2020   CO2 27 12/30/2020   TSH 1.53 12/30/2020   HGBA1C 6.6 (H) 12/30/2020       Assessment & Plan:   Problem List Items Addressed This Visit  DM type 2 with diabetic dyslipidemia (HCC)    Low carb diet and exercise.  Continue metformin.  Follow met b and a1c.   Lab Results  Component Value Date   HGBA1C 6.6 (H) 12/30/2020        Fasciculations    Describes what sounds like muscle fasciculations.  Will have neurology evaluate.       Relevant Orders   Ambulatory referral to Neurology   Hypothyroid    On thyroid replacement.  Follow tsh.       Leg cramps    Magnesium.  Stretches.  Stay hydrated.  Also describes what appear to be fasciculations.  Refer to neurology for further w/up.       Mild depression (HCC)    Continue zoloft.  Stable.       OSA (obstructive sleep apnea)    Using cpap regularly.  Just saw pulmonary - new supplies.        Palpitations    Not reported as a significant issue today.  Just saw cardiology.  Discussed further w/up - echo, monitor, etc.  Continue risk factor modifications.           Einar Pheasant, MD

## 2021-01-22 ENCOUNTER — Encounter: Payer: Self-pay | Admitting: Internal Medicine

## 2021-01-22 ENCOUNTER — Other Ambulatory Visit: Payer: Self-pay | Admitting: Internal Medicine

## 2021-01-22 DIAGNOSIS — R253 Fasciculation: Secondary | ICD-10-CM | POA: Insufficient documentation

## 2021-01-22 DIAGNOSIS — R002 Palpitations: Secondary | ICD-10-CM | POA: Insufficient documentation

## 2021-01-22 NOTE — Assessment & Plan Note (Signed)
Using cpap regularly.  Just saw pulmonary - new supplies.

## 2021-01-22 NOTE — Assessment & Plan Note (Signed)
Describes what sounds like muscle fasciculations.  Will have neurology evaluate.

## 2021-01-22 NOTE — Assessment & Plan Note (Signed)
Magnesium.  Stretches.  Stay hydrated.  Also describes what appear to be fasciculations.  Refer to neurology for further w/up.

## 2021-01-22 NOTE — Assessment & Plan Note (Signed)
Continue zoloft.  Stable.  

## 2021-01-22 NOTE — Assessment & Plan Note (Signed)
On thyroid replacement.  Follow tsh.  

## 2021-01-22 NOTE — Assessment & Plan Note (Signed)
Low carb diet and exercise.  Continue metformin.  Follow met b and a1c.   Lab Results  Component Value Date   HGBA1C 6.6 (H) 12/30/2020

## 2021-01-22 NOTE — Assessment & Plan Note (Signed)
Not reported as a significant issue today.  Just saw cardiology.  Discussed further w/up - echo, monitor, etc.  Continue risk factor modifications.

## 2021-01-28 ENCOUNTER — Encounter: Payer: Self-pay | Admitting: Internal Medicine

## 2021-02-01 NOTE — Telephone Encounter (Signed)
If she needs the generic sent in due to cost, I am ok with sending in generic, but please let her know that there may be more variably in the tablets (dosing), so we may need to adjust the dose.  We will follow her level.

## 2021-02-10 DIAGNOSIS — N6489 Other specified disorders of breast: Secondary | ICD-10-CM | POA: Diagnosis not present

## 2021-02-10 DIAGNOSIS — N644 Mastodynia: Secondary | ICD-10-CM | POA: Diagnosis not present

## 2021-02-10 DIAGNOSIS — C50912 Malignant neoplasm of unspecified site of left female breast: Secondary | ICD-10-CM | POA: Diagnosis not present

## 2021-02-17 ENCOUNTER — Other Ambulatory Visit: Payer: Self-pay | Admitting: Internal Medicine

## 2021-02-22 ENCOUNTER — Encounter: Payer: Self-pay | Admitting: Internal Medicine

## 2021-02-22 DIAGNOSIS — Z87898 Personal history of other specified conditions: Secondary | ICD-10-CM | POA: Diagnosis not present

## 2021-02-23 NOTE — Telephone Encounter (Signed)
Please call pt and see if she has a way to check her blood pressure at home.  These blood pressures are ok, but I would like for her to spot check her pressure and record.  Also, it appears she does not have a f/u appt scheduled with me. Per last visit, needed to schedule a 2 month f/u.  Please schedule f/u appt in 2-3 weeks. - needs to be a 12:00 appt.

## 2021-02-24 NOTE — Telephone Encounter (Signed)
Pt scheduled for appt with Dr Nicki Reaper and will spot check pressures and bring to appt

## 2021-02-27 ENCOUNTER — Other Ambulatory Visit: Payer: Self-pay | Admitting: Internal Medicine

## 2021-03-08 ENCOUNTER — Other Ambulatory Visit: Payer: Self-pay

## 2021-03-08 ENCOUNTER — Ambulatory Visit (INDEPENDENT_AMBULATORY_CARE_PROVIDER_SITE_OTHER): Payer: Medicare Other | Admitting: Internal Medicine

## 2021-03-08 DIAGNOSIS — E785 Hyperlipidemia, unspecified: Secondary | ICD-10-CM

## 2021-03-08 DIAGNOSIS — I10 Essential (primary) hypertension: Secondary | ICD-10-CM

## 2021-03-08 DIAGNOSIS — F320 Major depressive disorder, single episode, mild: Secondary | ICD-10-CM | POA: Diagnosis not present

## 2021-03-08 DIAGNOSIS — E1169 Type 2 diabetes mellitus with other specified complication: Secondary | ICD-10-CM

## 2021-03-08 DIAGNOSIS — E039 Hypothyroidism, unspecified: Secondary | ICD-10-CM

## 2021-03-08 DIAGNOSIS — G4733 Obstructive sleep apnea (adult) (pediatric): Secondary | ICD-10-CM

## 2021-03-08 DIAGNOSIS — F32A Depression, unspecified: Secondary | ICD-10-CM

## 2021-03-08 NOTE — Progress Notes (Signed)
Patient ID: Nadiah Corbit, female   DOB: 1956-07-04, 65 y.o.   MRN: [id number]   Subjective:    Patient ID: Loanne Emery, female    DOB: 1955-12-03, 65 y.o.   MRN: [id number]  HPI This visit occurred during the SARS-CoV-2 public health emergency.  Safety protocols were in place, including screening questions prior to the visit, additional usage of staff PPE, and extensive cleaning of exam room while observing appropriate contact time as indicated for disinfecting solutions.  Patient here for a scheduled follow up. Here to follow up regarding her blood pressure.  Recently diagnosed with breast cancer.  S/p surgery and RT. Started on arimidex 11/2020.  Has been monitoring outside blood pressures.  Most blood pressures averaging 110-120s/60-70s.  Tries to stay active.  No chest pain or sob reported.  No abdominal pain.  Bowels moving.  Handling stress.  Plans to start exercising more.   Past Medical History:  Diagnosis Date  . Depression   . Diabetes mellitus without complication ([place])   . History of carpal tunnel syndrome   . Hyperlipidemia   . Hypertension   . Hypothyroidism   . OSA (obstructive sleep apnea)    Past Surgical History:  Procedure Laterality Date  . ABDOMINAL HYSTERECTOMY     ovaries left in place  . brreast biopsy    . FOOT SURGERY    . TONSILLECTOMY AND ADENOIDECTOMY     Family History  Problem Relation Age of Onset  . Heart disease Mother    Social History   Socioeconomic History  . Marital status: Married    Spouse name: Not on file  . Number of children: 2  . Years of education: Not on file  . Highest education level: Not on file  Occupational History  . Not on file  Tobacco Use  . Smoking status: Never Smoker  . Smokeless tobacco: Never Used  Substance and Sexual Activity  . Alcohol use: Not on file  . Drug use: Never  . Sexual activity: Not on file  Other Topics Concern  . Not on file  Social History Narrative  . Not on file   Social  Determinants of Health   Financial Resource Strain: Not on file  Food Insecurity: Not on file  Transportation Needs: Not on file  Physical Activity: Not on file  Stress: Not on file  Social Connections: Not on file    Outpatient Encounter Medications as of 03/08/2021  Medication Sig  . anastrozole (ARIMIDEX) 1 MG tablet Take 1 mg by mouth daily.  Marland Kitchen aspirin 81 MG chewable tablet Chew 1 tablet by mouth daily.  Marland Kitchen FLOVENT HFA 110 MCG/ACT inhaler Inhale 1 puff into the lungs 2 (two) times daily.  Marland Kitchen levothyroxine (SYNTHROID) 100 MCG tablet TAKE 1 TABLET BY MOUTH EVERY DAY  . lisinopril-hydrochlorothiazide (ZESTORETIC) 20-25 MG tablet Take 1 tablet by mouth daily.  . metFORMIN (GLUCOPHAGE) 500 MG tablet TAKE 1 TABLET BY MOUTH EVERY DAY  . rosuvastatin (CRESTOR) 20 MG tablet TAKE 1 TABLET BY MOUTH EVERY DAY  . sertraline (ZOLOFT) 50 MG tablet TAKE 1 TABLET (50 MG TOTAL) BY MOUTH IN THE MORNING AND AT BEDTIME.   No facility-administered encounter medications on file as of 03/08/2021.    Review of Systems  Constitutional: Negative for appetite change and unexpected weight change.  HENT: Negative for congestion and sinus pressure.   Respiratory: Negative for cough, chest tightness and shortness of breath.   Cardiovascular: Negative for chest pain, palpitations and leg swelling.  Gastrointestinal:  Negative for abdominal pain, diarrhea, nausea and vomiting.  Genitourinary: Negative for difficulty urinating and dysuria.  Musculoskeletal: Negative for joint swelling and myalgias.  Skin: Negative for color change and rash.  Neurological: Negative for dizziness, light-headedness and headaches.  Psychiatric/Behavioral: Negative for agitation and dysphoric mood.       Objective:    Physical Exam Vitals reviewed.  Constitutional:      General: She is not in acute distress.    Appearance: Normal appearance.  HENT:     Head: Normocephalic and atraumatic.     Right Ear: External ear normal.      Left Ear: External ear normal.  Eyes:     General: No scleral icterus.       Right eye: No discharge.        Left eye: No discharge.     Conjunctiva/sclera: Conjunctivae normal.  Neck:     Thyroid: No thyromegaly.  Cardiovascular:     Rate and Rhythm: Normal rate and regular rhythm.  Pulmonary:     Effort: No respiratory distress.     Breath sounds: Normal breath sounds. No wheezing.  Abdominal:     General: Bowel sounds are normal.     Palpations: Abdomen is soft.     Tenderness: There is no abdominal tenderness.  Musculoskeletal:        General: No swelling or tenderness.     Cervical back: Neck supple. No tenderness.  Lymphadenopathy:     Cervical: No cervical adenopathy.  Skin:    Findings: No erythema or rash.  Neurological:     Mental Status: She is alert.  Psychiatric:        Mood and Affect: Mood normal.        Behavior: Behavior normal.     BP 130/74   Pulse (!) 57   Temp 97.9 F (36.6 C) (Temporal)   Resp 16   Ht 5' 2"  (1.575 m)   Wt 201 lb (91.2 kg)   SpO2 99%   BMI 36.76 kg/m  Wt Readings from Last 3 Encounters:  03/08/21 201 lb (91.2 kg)  01/12/21 199 lb 3.2 oz (90.4 kg)  01/05/21 198 lb 3.2 oz (89.9 kg)     Lab Results  Component Value Date   WBC 7.9 07/30/2020   HGB 12.4 07/30/2020   HCT 37.7 07/30/2020   PLT 188.0 07/30/2020   GLUCOSE 98 12/30/2020   CHOL 123 12/30/2020   TRIG 115.0 12/30/2020   HDL 50.70 12/30/2020   LDLCALC 49 12/30/2020   ALT 28 12/30/2020   AST 24 12/30/2020   NA 140 12/30/2020   K 3.7 12/30/2020   CL 102 12/30/2020   CREATININE 1.17 12/30/2020   BUN 25 (H) 12/30/2020   CO2 27 12/30/2020   TSH 1.53 12/30/2020   HGBA1C 6.6 (H) 12/30/2020       Assessment & Plan:   Problem List Items Addressed This Visit    DM type 2 with diabetic dyslipidemia ([place])    Low carb diet and exercise.  Continue metformin.  Follow met b and a1c.       Hypertension    Blood pressure as outlined.  Reviewed outside readings.   Overall appear to be doing well.  Continue lisinopril/hctz q day.  Follow pressures.  Follow metabolic panel.       Hypothyroid    On thyroid replacement.  Follow tsh.       Mild depression (HCC)    Continue zoloft.  Stable.  OSA (obstructive sleep apnea)    Continue cpap.           Einar Pheasant, MD

## 2021-03-11 DIAGNOSIS — H40003 Preglaucoma, unspecified, bilateral: Secondary | ICD-10-CM | POA: Diagnosis not present

## 2021-03-11 DIAGNOSIS — E119 Type 2 diabetes mellitus without complications: Secondary | ICD-10-CM | POA: Diagnosis not present

## 2021-03-11 LAB — HM DIABETES EYE EXAM

## 2021-03-14 ENCOUNTER — Encounter: Payer: Self-pay | Admitting: Internal Medicine

## 2021-03-14 DIAGNOSIS — I10 Essential (primary) hypertension: Secondary | ICD-10-CM | POA: Insufficient documentation

## 2021-03-14 NOTE — Assessment & Plan Note (Signed)
Continue zoloft.  Stable.  

## 2021-03-14 NOTE — Assessment & Plan Note (Signed)
Low carb diet and exercise.  Continue metformin.  Follow met b and a1c.

## 2021-03-14 NOTE — Assessment & Plan Note (Signed)
Blood pressure as outlined.  Reviewed outside readings.  Overall appear to be doing well.  Continue lisinopril/hctz q day.  Follow pressures.  Follow metabolic panel.

## 2021-03-14 NOTE — Assessment & Plan Note (Signed)
On thyroid replacement.  Follow tsh.  

## 2021-03-14 NOTE — Assessment & Plan Note (Signed)
Continue cpap.  

## 2021-03-15 ENCOUNTER — Other Ambulatory Visit: Payer: Self-pay | Admitting: Internal Medicine

## 2021-03-28 ENCOUNTER — Other Ambulatory Visit: Payer: Self-pay | Admitting: Internal Medicine

## 2021-04-07 ENCOUNTER — Ambulatory Visit (INDEPENDENT_AMBULATORY_CARE_PROVIDER_SITE_OTHER): Payer: Medicare Other

## 2021-04-07 ENCOUNTER — Other Ambulatory Visit: Payer: Self-pay | Admitting: Internal Medicine

## 2021-04-07 VITALS — Ht 62.0 in | Wt 201.0 lb

## 2021-04-07 DIAGNOSIS — Z0000 Encounter for general adult medical examination without abnormal findings: Secondary | ICD-10-CM

## 2021-04-07 NOTE — Progress Notes (Signed)
Subjective:   Tonya Greer is a 65 y.o. female who presents for an Initial Medicare Annual Wellness Visit.  Review of Systems    No ROS.  Medicare Wellness Virtual Visit.  Visual/audio telehealth visit, UTA vital signs.   See social history for additional risk factors.   Cardiac Risk Factors include: advanced age (>46men, >47 women)     Objective:    Today's Vitals   04/07/21 0831  Weight: 201 lb (91.2 kg)  Height: 5\' 2"  (1.575 m)   Body mass index is 36.76 kg/m.  Advanced Directives 04/07/2021  Does Patient Have a Medical Advance Directive? Yes  Does patient want to make changes to medical advance directive? No - Patient declined    Current Medications (verified) Outpatient Encounter Medications as of 04/07/2021  Medication Sig   anastrozole (ARIMIDEX) 1 MG tablet Take 1 mg by mouth daily.   aspirin 81 MG chewable tablet Chew 1 tablet by mouth daily.   FLOVENT HFA 110 MCG/ACT inhaler Inhale 1 puff into the lungs 2 (two) times daily.   levothyroxine (SYNTHROID) 100 MCG tablet TAKE 1 TABLET BY MOUTH EVERY DAY   lisinopril-hydrochlorothiazide (ZESTORETIC) 20-25 MG tablet Take 1 tablet by mouth daily.   metFORMIN (GLUCOPHAGE) 500 MG tablet TAKE 1 TABLET BY MOUTH EVERY DAY   sertraline (ZOLOFT) 50 MG tablet TAKE 1 TABLET (50 MG TOTAL) BY MOUTH IN THE MORNING AND AT BEDTIME.   [DISCONTINUED] rosuvastatin (CRESTOR) 20 MG tablet TAKE 1 TABLET BY MOUTH EVERY DAY   No facility-administered encounter medications on file as of 04/07/2021.    Allergies (verified) Hydrocodone and Oxycodone   History: Past Medical History:  Diagnosis Date   Depression    Diabetes mellitus without complication (HCC)    History of carpal tunnel syndrome    Hyperlipidemia    Hypertension    Hypothyroidism    OSA (obstructive sleep apnea)    Past Surgical History:  Procedure Laterality Date   ABDOMINAL HYSTERECTOMY     ovaries left in place   brreast biopsy     FOOT SURGERY      TONSILLECTOMY AND ADENOIDECTOMY     Family History  Problem Relation Age of Onset   Heart disease Mother    Social History   Socioeconomic History   Marital status: Married    Spouse name: Not on file   Number of children: 2   Years of education: Not on file   Highest education level: Not on file  Occupational History   Not on file  Tobacco Use   Smoking status: Never   Smokeless tobacco: Never  Substance and Sexual Activity   Alcohol use: Not on file   Drug use: Never   Sexual activity: Not on file  Other Topics Concern   Not on file  Social History Narrative   Not on file   Social Determinants of Health   Financial Resource Strain: Low Risk    Difficulty of Paying Living Expenses: Not hard at all  Food Insecurity: No Food Insecurity   Worried About Charity fundraiser in the Last Year: Never true   Ran Out of Food in the Last Year: Never true  Transportation Needs: No Transportation Needs   Lack of Transportation (Medical): No   Lack of Transportation (Non-Medical): No  Physical Activity: Not on file  Stress: No Stress Concern Present   Feeling of Stress : Not at all  Social Connections: Unknown   Frequency of Communication with Friends and Family: Not  on file   Frequency of Social Gatherings with Friends and Family: Not on file   Attends Religious Services: Not on file   Active Member of Clubs or Organizations: Not on file   Attends Archivist Meetings: Not on file   Marital Status: Married    Tobacco Counseling Counseling given: Not Answered   Clinical Intake:  Pre-visit preparation completed: Yes       Diabetes: No  How often do you need to have someone help you when you read instructions, pamphlets, or other written materials from your doctor or pharmacy?: 1 - Never  Nutrition Risk Assessment:  Has the patient had any N/V/D within the last 2 months?  No  Does the patient have any non-healing wounds?  No  Has the patient had any  unintentional weight loss or weight gain?  No   Diabetes:  If diabetic, was a CBG obtained today?  No  Did the patient bring in their glucometer from home?  No  How often do you monitor your CBG's? 1-2 monthly.   Financial Strains and Diabetes Management:  Are you having any financial strains with the device, your supplies or your medication? No .  Does the patient want to be seen by Chronic Care Management for management of their diabetes?  No  Would the patient like to be referred to a Nutritionist or for Diabetic Management?  No    Interpreter Needed?: No    Activities of Daily Living In your present state of health, do you have any difficulty performing the following activities: 04/07/2021  Hearing? N  Vision? N  Difficulty concentrating or making decisions? N  Walking or climbing stairs? N  Dressing or bathing? N  Doing errands, shopping? N  Preparing Food and eating ? N  Using the Toilet? N  In the past six months, have you accidently leaked urine? N  Do you have problems with loss of bowel control? N  Managing your Medications? N  Managing your Finances? N  Housekeeping or managing your Housekeeping? N  Some recent data might be hidden    Patient Care Team: Einar Pheasant, MD as PCP - General (Internal Medicine)  Indicate any recent Medical Services you may have received from other than [facility] providers in the past year (date may be approximate).     Assessment:   This is a routine wellness examination for Tonya Greer.  I connected with Kamree today by telephone and verified that I am speaking with the correct person using two identifiers. Location patient: home Location provider: work Persons participating in the virtual visit: patient, Marine scientist.    I discussed the limitations, risks, security and privacy concerns of performing an evaluation and management service by telephone and the availability of in person appointments. The patient expressed understanding and  verbally consented to this telephonic visit.    Interactive audio and video telecommunications were attempted between this provider and patient, however failed, due to patient having technical difficulties OR patient did not have access to video capability.  We continued and completed visit with audio only.  Some vital signs may be absent or patient reported.   Hearing/Vision screen Hearing Screening - Comments:: Patient is able to hear conversational tones without difficulty.  No issues reported.  Vision Screening - Comments:: Wears corrective lenses Visual acuity not assessed, virtual visit.  They have seen their ophthalmologist in the last 12 months.    Dietary issues and exercise activities discussed: Current Exercise Habits: Home exercise routine, Intensity: Mild  Low carb diet Good water intake   Goals Addressed             This Visit's Progress    Follow up with Primary Care Provider       As needed        Depression Screen PHQ 2/9 Scores 04/07/2021 03/08/2021 01/12/2021  PHQ - 2 Score 0 0 0  PHQ- 9 Score 0 0 -    Fall Risk Fall Risk  04/07/2021  Falls in the past year? 0  Number falls in past yr: 0  Injury with Fall? 0  Follow up Falls evaluation completed    [place]: Handrails in use when climbing stairs? Yes Home free of loose throw rugs in walkways, pet beds, electrical cords, etc? Yes  Adequate lighting in your home to reduce risk of falls? Yes   ASSISTIVE DEVICES UTILIZED TO PREVENT FALLS: Life alert? No  Use of a cane, walker or w/c? No   TIMED UP AND GO: Was the test performed? No . Virtual visit.   Cognitive Function:  Patient is alert and oriented x3.  Denies difficulty focusing, making decisions, memory loss.  MMSE/6CIT deferred. Normal by direct communication/observation.     Immunizations Immunization History  Administered Date(s) Administered   Fluad Quad(high Dose 65+) 07/30/2020   Influenza Split  07/29/2014, 08/17/2015   Influenza, Quadrivalent, Recombinant, Inj, Pf 07/04/2019   Influenza, Seasonal, Injecte, Preservative Fre 07/16/2017, 06/18/2018   Moderna Sars-Covid-2 Vaccination 11/11/2019, 12/10/2019, 05/31/2020   Pneumococcal Conjugate-13 11/19/2014   Pneumococcal Polysaccharide-23 12/25/2018   Tdap 05/21/2019   Zoster Recombinat (Shingrix) 06/18/2018, 08/18/2018   Health Maintenance Health Maintenance  Topic Date Due   COVID-19 Vaccine (4 - Booster for Moderna series) 04/23/2021 (Originally 08/31/2020)   DEXA SCAN  05/20/2021 (Originally 03/15/2020)   COLONOSCOPY (Pts 45-66yrs Insurance coverage will need to be confirmed)  05/20/2021 (Originally 03/15/2000)   Hepatitis C Screening  04/07/2022 (Originally 03/15/1974)   INFLUENZA VACCINE  05/16/2021   HEMOGLOBIN A1C  07/02/2021   FOOT EXAM  01/12/2022   OPHTHALMOLOGY EXAM  03/11/2022   MAMMOGRAM  07/09/2022   PNA vac Low Risk Adult (2 of 2 - PPSV23) 12/25/2023   TETANUS/TDAP  05/20/2029   Zoster Vaccines- Shingrix  Completed   HPV VACCINES  Aged Out   Colonoscopy- notes previously completed. Notes not in chart.   Mammogram- Reports completed 07/2020.[facility].  Bone Density- Reports completed 07/2020.[facility].   There are no preventive care reminders to display for this patient.  Lung Cancer Screening: (Low Dose CT Chest recommended if Age 73-80 years, 30 pack-year currently smoking OR have quit w/in 15years.) does not qualify.   Hepatitis C Screening: deferred per patient preference.   Dental Screening: Recommended annual dental exams for proper oral hygiene.  Community Resource Referral / Chronic Care Management: CRR required this visit?  No   CCM required this visit?  No      Plan:   Keep all routine maintenance appointments.   I have personally reviewed and noted the following in the patient's chart:   Medical and social history Use of alcohol, tobacco or illicit drugs  Current medications and supplements  including opioid prescriptions. Patient is not currently taking opioid prescriptions. Functional ability and status Nutritional status Physical activity Advanced directives List of other physicians Hospitalizations, surgeries, and ER visits in previous 12 months Vitals Screenings to include cognitive, depression, and falls Referrals and appointments  In addition, I have reviewed and discussed with patient  certain preventive protocols, quality metrics, and best practice recommendations. A written personalized care plan for preventive services as well as general preventive health recommendations were provided to patient via mychart.     Varney Biles, LPN   [id number]

## 2021-04-07 NOTE — Patient Instructions (Addendum)
Tonya Greer , Thank you for taking time to come for your Medicare Wellness Visit. I appreciate your ongoing commitment to your health goals. Please review the following plan we discussed and let me know if I can assist you in the future.   These are the goals we discussed:  Goals      Follow up with Primary Care Provider     As needed         This is a list of the screening recommended for you and due dates:  Health Maintenance  Topic Date Due   COVID-19 Vaccine (4 - Booster for Moderna series) 04/23/2021*   DEXA scan (bone density measurement)  05/20/2021*   Colon Cancer Screening  05/20/2021*   Hepatitis C Screening: USPSTF Recommendation to screen - Ages 18-79 yo.  04/07/2022*   Flu Shot  05/16/2021   Hemoglobin A1C  07/02/2021   Complete foot exam   01/12/2022   Eye exam for diabetics  03/11/2022   Mammogram  07/09/2022   Pneumonia vaccines (2 of 2 - PPSV23) 12/25/2023   Tetanus Vaccine  05/20/2029   Zoster (Shingles) Vaccine  Completed   HPV Vaccine  Aged Out  *Topic was postponed. The date shown is not the original due date.    Conditions/risks identified: none new.  Follow up in one year for your annual wellness visit.   Preventive Care 66 Years and Older, Female Preventive care refers to lifestyle choices and visits with your health care provider that can promote health and wellness. What does preventive care include? A yearly physical exam. This is also called an annual well check. Dental exams once or twice a year. Routine eye exams. Ask your health care provider how often you should have your eyes checked. Personal lifestyle choices, including: Daily care of your teeth and gums. Regular physical activity. Eating a healthy diet. Avoiding tobacco and drug use. Limiting alcohol use. Practicing safe sex. Taking low-dose aspirin every day. Taking vitamin and mineral supplements as recommended by your health care provider. What happens during an annual well  check? The services and screenings done by your health care provider during your annual well check will depend on your age, overall health, lifestyle risk factors, and family history of disease. Counseling  Your health care provider may ask you questions about your: Alcohol use. Tobacco use. Drug use. Emotional well-being. Home and relationship well-being. Sexual activity. Eating habits. History of falls. Memory and ability to understand (cognition). Work and work Statistician. Reproductive health. Screening  You may have the following tests or measurements: Height, weight, and BMI. Blood pressure. Lipid and cholesterol levels. These may be checked every 5 years, or more frequently if you are over 87 years old. Skin check. Lung cancer screening. You may have this screening every year starting at age 58 if you have a 30-pack-year history of smoking and currently smoke or have quit within the past 15 years. Fecal occult blood test (FOBT) of the stool. You may have this test every year starting at age 59. Flexible sigmoidoscopy or colonoscopy. You may have a sigmoidoscopy every 5 years or a colonoscopy every 10 years starting at age 34. Hepatitis C blood test. Hepatitis B blood test. Sexually transmitted disease (STD) testing. Diabetes screening. This is done by checking your blood sugar (glucose) after you have not eaten for a while (fasting). You may have this done every 1-3 years. Bone density scan. This is done to screen for osteoporosis. You may have this done starting at  age 31. Mammogram. This may be done every 1-2 years. Talk to your health care provider about how often you should have regular mammograms. Talk with your health care provider about your test results, treatment options, and if necessary, the need for more tests. Vaccines  Your health care provider may recommend certain vaccines, such as: Influenza vaccine. This is recommended every year. Tetanus, diphtheria, and  acellular pertussis (Tdap, Td) vaccine. You may need a Td booster every 10 years. Zoster vaccine. You may need this after age 58. Pneumococcal 13-valent conjugate (PCV13) vaccine. One dose is recommended after age 70. Pneumococcal polysaccharide (PPSV23) vaccine. One dose is recommended after age 7. Talk to your health care provider about which screenings and vaccines you need and how often you need them. This information is not intended to replace advice given to you by your health care provider. Make sure you discuss any questions you have with your health care provider. Document Released: 10/29/2015 Document Revised: 06/21/2016 Document Reviewed: 08/03/2015 Elsevier Interactive Patient Education  2017 [place] Prevention in the Home Falls can cause injuries. They can happen to people of all ages. There are many things you can do to make your home safe and to help prevent falls. What can I do on the outside of my home? Regularly fix the edges of walkways and driveways and fix any cracks. Remove anything that might make you trip as you walk through a door, such as a raised step or threshold. Trim any bushes or trees on the path to your home. Use bright outdoor lighting. Clear any walking paths of anything that might make someone trip, such as rocks or tools. Regularly check to see if handrails are loose or broken. Make sure that both sides of any steps have handrails. Any raised decks and porches should have guardrails on the edges. Have any leaves, snow, or ice cleared regularly. Use sand or salt on walking paths during winter. Clean up any spills in your garage right away. This includes oil or grease spills. What can I do in the bathroom? Use night lights. Install grab bars by the toilet and in the tub and shower. Do not use towel bars as grab bars. Use non-skid mats or decals in the tub or shower. If you need to sit down in the shower, use a plastic, non-slip stool. Keep  the floor dry. Clean up any water that spills on the floor as soon as it happens. Remove soap buildup in the tub or shower regularly. Attach bath mats securely with double-sided non-slip rug tape. Do not have throw rugs and other things on the floor that can make you trip. What can I do in the bedroom? Use night lights. Make sure that you have a light by your bed that is easy to reach. Do not use any sheets or blankets that are too big for your bed. They should not hang down onto the floor. Have a firm chair that has side arms. You can use this for support while you get dressed. Do not have throw rugs and other things on the floor that can make you trip. What can I do in the kitchen? Clean up any spills right away. Avoid walking on wet floors. Keep items that you use a lot in easy-to-reach places. If you need to reach something above you, use a strong step stool that has a grab bar. Keep electrical cords out of the way. Do not use floor polish or wax that makes floors  slippery. If you must use wax, use non-skid floor wax. Do not have throw rugs and other things on the floor that can make you trip. What can I do with my stairs? Do not leave any items on the stairs. Make sure that there are handrails on both sides of the stairs and use them. Fix handrails that are broken or loose. Make sure that handrails are as long as the stairways. Check any carpeting to make sure that it is firmly attached to the stairs. Fix any carpet that is loose or worn. Avoid having throw rugs at the top or bottom of the stairs. If you do have throw rugs, attach them to the floor with carpet tape. Make sure that you have a light switch at the top of the stairs and the bottom of the stairs. If you do not have them, ask someone to add them for you. What else can I do to help prevent falls? Wear shoes that: Do not have high heels. Have rubber bottoms. Are comfortable and fit you well. Are closed at the toe. Do not  wear sandals. If you use a stepladder: Make sure that it is fully opened. Do not climb a closed stepladder. Make sure that both sides of the stepladder are locked into place. Ask someone to hold it for you, if possible. Clearly mark and make sure that you can see: Any grab bars or handrails. First and last steps. Where the edge of each step is. Use tools that help you move around (mobility aids) if they are needed. These include: Canes. Walkers. Scooters. Crutches. Turn on the lights when you go into a dark area. Replace any light bulbs as soon as they burn out. Set up your furniture so you have a clear path. Avoid moving your furniture around. If any of your floors are uneven, fix them. If there are any pets around you, be aware of where they are. Review your medicines with your doctor. Some medicines can make you feel dizzy. This can increase your chance of falling. Ask your doctor what other things that you can do to help prevent falls. This information is not intended to replace advice given to you by your health care provider. Make sure you discuss any questions you have with your health care provider. Document Released: 07/29/2009 Document Revised: 03/09/2016 Document Reviewed: 11/06/2014 Elsevier Interactive Patient Education  2017 [employer].

## 2021-04-11 DIAGNOSIS — E531 Pyridoxine deficiency: Secondary | ICD-10-CM | POA: Diagnosis not present

## 2021-04-11 DIAGNOSIS — E538 Deficiency of other specified B group vitamins: Secondary | ICD-10-CM | POA: Diagnosis not present

## 2021-04-11 DIAGNOSIS — R253 Fasciculation: Secondary | ICD-10-CM | POA: Diagnosis not present

## 2021-04-11 DIAGNOSIS — E519 Thiamine deficiency, unspecified: Secondary | ICD-10-CM | POA: Diagnosis not present

## 2021-04-25 ENCOUNTER — Other Ambulatory Visit: Payer: Self-pay | Admitting: Internal Medicine

## 2021-05-04 ENCOUNTER — Other Ambulatory Visit: Payer: Self-pay | Admitting: Internal Medicine

## 2021-05-18 ENCOUNTER — Other Ambulatory Visit: Payer: Self-pay | Admitting: Internal Medicine

## 2021-05-20 ENCOUNTER — Encounter: Payer: Self-pay | Admitting: Internal Medicine

## 2021-05-20 ENCOUNTER — Other Ambulatory Visit: Payer: Self-pay

## 2021-05-20 ENCOUNTER — Ambulatory Visit (INDEPENDENT_AMBULATORY_CARE_PROVIDER_SITE_OTHER): Payer: Medicare Other | Admitting: Internal Medicine

## 2021-05-20 ENCOUNTER — Other Ambulatory Visit (HOSPITAL_COMMUNITY)
Admission: RE | Admit: 2021-05-20 | Discharge: 2021-05-20 | Disposition: A | Discharge status: Home / Self Care | Payer: Medicare Other | Source: Ambulatory Visit | Attending: Internal Medicine | Admitting: Internal Medicine

## 2021-05-20 VITALS — BP 114/68 | HR 55 | Temp 96.3°F | Ht 62.01 in | Wt 192.8 lb

## 2021-05-20 DIAGNOSIS — E1169 Type 2 diabetes mellitus with other specified complication: Secondary | ICD-10-CM

## 2021-05-20 DIAGNOSIS — Z0000 Encounter for general adult medical examination without abnormal findings: Secondary | ICD-10-CM | POA: Insufficient documentation

## 2021-05-20 DIAGNOSIS — Z1151 Encounter for screening for human papillomavirus (HPV): Secondary | ICD-10-CM | POA: Insufficient documentation

## 2021-05-20 DIAGNOSIS — E785 Hyperlipidemia, unspecified: Secondary | ICD-10-CM

## 2021-05-20 DIAGNOSIS — I10 Essential (primary) hypertension: Secondary | ICD-10-CM | POA: Diagnosis not present

## 2021-05-20 DIAGNOSIS — G4733 Obstructive sleep apnea (adult) (pediatric): Secondary | ICD-10-CM

## 2021-05-20 DIAGNOSIS — R253 Fasciculation: Secondary | ICD-10-CM

## 2021-05-20 DIAGNOSIS — R002 Palpitations: Secondary | ICD-10-CM

## 2021-05-20 DIAGNOSIS — F320 Major depressive disorder, single episode, mild: Secondary | ICD-10-CM

## 2021-05-20 DIAGNOSIS — E039 Hypothyroidism, unspecified: Secondary | ICD-10-CM

## 2021-05-20 DIAGNOSIS — Z01419 Encounter for gynecological examination (general) (routine) without abnormal findings: Secondary | ICD-10-CM | POA: Insufficient documentation

## 2021-05-20 DIAGNOSIS — F32A Depression, unspecified: Secondary | ICD-10-CM

## 2021-05-20 LAB — CBC WITH DIFFERENTIAL/PLATELET
Basophils Absolute: 0 10*3/uL (ref 0.0–0.1)
Basophils Relative: 0.9 % (ref 0.0–3.0)
Eosinophils Absolute: 0.2 10*3/uL (ref 0.0–0.7)
Eosinophils Relative: 4.3 % (ref 0.0–5.0)
HCT: 37.4 % (ref 36.0–46.0)
Hemoglobin: 12.3 g/dL (ref 12.0–15.0)
Lymphocytes Relative: 17.6 % (ref 12.0–46.0)
Lymphs Abs: 0.9 10*3/uL (ref 0.7–4.0)
MCHC: 33 g/dL (ref 30.0–36.0)
MCV: 94.8 fl (ref 78.0–100.0)
Monocytes Absolute: 0.4 10*3/uL (ref 0.1–1.0)
Monocytes Relative: 7.3 % (ref 3.0–12.0)
Neutro Abs: 3.6 10*3/uL (ref 1.4–7.7)
Neutrophils Relative %: 69.9 % (ref 43.0–77.0)
Platelets: 160 10*3/uL (ref 150.0–400.0)
RBC: 3.95 Mil/uL (ref 3.87–5.11)
RDW: 14.6 % (ref 11.5–15.5)
WBC: 5.2 10*3/uL (ref 4.0–10.5)

## 2021-05-20 LAB — TSH: TSH: 1.42 u[IU]/mL (ref 0.35–5.50)

## 2021-05-20 LAB — LIPID PANEL
Cholesterol: 148 mg/dL (ref 0–200)
HDL: 56.1 mg/dL (ref >39.00)
LDL Cholesterol: 69 mg/dL (ref 0–99)
NonHDL: 92.37
Total CHOL/HDL Ratio: 3
Triglycerides: 118 mg/dL (ref 0.0–149.0)
VLDL: 23.6 mg/dL (ref 0.0–40.0)

## 2021-05-20 LAB — BASIC METABOLIC PANEL
BUN: 19 mg/dL (ref 6–23)
CO2: 29 mEq/L (ref 19–32)
Calcium: 9.7 mg/dL (ref 8.4–10.5)
Chloride: 102 mEq/L (ref 96–112)
Creatinine, Ser: 1.1 mg/dL (ref 0.40–1.20)
GFR: 52.48 mL/min — ABNORMAL LOW (ref >60.00)
Glucose, Bld: 107 mg/dL — ABNORMAL HIGH (ref 70–99)
Potassium: 3.5 mEq/L (ref 3.5–5.1)
Sodium: 139 mEq/L (ref 135–145)

## 2021-05-20 LAB — HEPATIC FUNCTION PANEL
ALT: 20 U/L (ref 0–35)
AST: 19 U/L (ref 0–37)
Albumin: 4.5 g/dL (ref 3.5–5.2)
Alkaline Phosphatase: 55 U/L (ref 39–117)
Bilirubin, Direct: 0.1 mg/dL (ref 0.0–0.3)
Total Bilirubin: 0.6 mg/dL (ref 0.2–1.2)
Total Protein: 7.2 g/dL (ref 6.0–8.3)

## 2021-05-20 NOTE — Progress Notes (Signed)
Patient ID: Tonya Greer, female   DOB: 1955/12/28, 66 y.o.   MRN: [id number]   Subjective:    Patient ID: Tonya Greer, female    DOB: 05/16/1956, 65 y.o.   MRN: [id number]  HPI This visit occurred during the SARS-CoV-2 public health emergency.  Safety protocols were in place, including screening questions prior to the visit, additional usage of staff PPE, and extensive cleaning of exam room while observing appropriate contact time as indicated for disinfecting solutions.   Patient here for her physical exam.  She reports she is doing relatively well.  Increased stress recently.  Saw neurology for muscle fasciculations.  Recommended starting alpha lipoic acid.  Recommended NCS.  She has elected to hold on having this test.  Labs revealed positive m spike.  Recommended hematology referral.  She is seeing oncology for f/u breast cancer.  Discussed can f/u regarding M-spike with hematology/oncology at [facility] -given already being followed.  Increased stress related to these tests and further w/up.  Discussed.  On zoloft.  Does not feel needs any further intervention.  Has good support.  Discussed staying active.  No chest pain or sob reported.  No acid reflux or abdominal pain reported.  Bowels moving.  Muscle twitching is better.  Blood pressures averaging 120s-130s/60-70s.  She has f/u with oncology (for of her breast cancer) next week.  Still some discomfort left breast - when lying on left side.  Has been evaluated.  Had diagnostic mammogram - ok.     Past Medical History:  Diagnosis Date   Depression    Diabetes mellitus without complication (HCC)    History of carpal tunnel syndrome    Hyperlipidemia    Hypertension    Hypothyroidism    OSA (obstructive sleep apnea)    Past Surgical History:  Procedure Laterality Date   ABDOMINAL HYSTERECTOMY     ovaries left in place   brreast biopsy     FOOT SURGERY     TONSILLECTOMY AND ADENOIDECTOMY     Family History  Problem Relation Age of Onset    Heart disease Mother    Social History   Socioeconomic History   Marital status: Married    Spouse name: Not on file   Number of children: 2   Years of education: Not on file   Highest education level: Not on file  Occupational History   Not on file  Tobacco Use   Smoking status: Never   Smokeless tobacco: Never  Substance and Sexual Activity   Alcohol use: Not on file   Drug use: Never   Sexual activity: Not on file  Other Topics Concern   Not on file  Social History Narrative   Not on file   Social Determinants of Health   Financial Resource Strain: Low Risk    Difficulty of Paying Living Expenses: Not hard at all  Food Insecurity: No Food Insecurity   Worried About Charity fundraiser in the Last Year: Never true   Ran Out of Food in the Last Year: Never true  Transportation Needs: No Transportation Needs   Lack of Transportation (Medical): No   Lack of Transportation (Non-Medical): No  Physical Activity: Not on file  Stress: No Stress Concern Present   Feeling of Stress : Not at all  Social Connections: Unknown   Frequency of Communication with Friends and Family: Not on file   Frequency of Social Gatherings with Friends and Family: Not on file   Attends Religious Services: Not on  file   Active Member of Clubs or Organizations: Not on file   Attends Archivist Meetings: Not on file   Marital Status: Married    Review of Systems  Constitutional:  Negative for appetite change and unexpected weight change.  HENT:  Negative for congestion, sinus pressure and sore throat.   Eyes:  Negative for pain and visual disturbance.  Respiratory:  Negative for cough, chest tightness and shortness of breath.   Cardiovascular:  Negative for chest pain, palpitations and leg swelling.  Gastrointestinal:  Negative for abdominal pain, diarrhea, nausea and vomiting.  Genitourinary:  Negative for difficulty urinating and dysuria.  Musculoskeletal:  Negative for joint  swelling and myalgias.  Skin:  Negative for color change and rash.  Neurological:  Negative for dizziness, light-headedness and headaches.  Hematological:  Negative for adenopathy. Does not bruise/bleed easily.  Psychiatric/Behavioral:  Negative for agitation and dysphoric mood.       Objective:    Physical Exam Vitals reviewed.  Constitutional:      General: She is not in acute distress.    Appearance: Normal appearance.  HENT:     Head: Normocephalic and atraumatic.     Right Ear: External ear normal.     Left Ear: External ear normal.  Eyes:     General: No scleral icterus.       Right eye: No discharge.        Left eye: No discharge.     Conjunctiva/sclera: Conjunctivae normal.  Neck:     Thyroid: No thyromegaly.  Cardiovascular:     Rate and Rhythm: Normal rate and regular rhythm.  Pulmonary:     Effort: No respiratory distress.     Breath sounds: Normal breath sounds. No wheezing.     Comments: Breasts:  well healed incision site - left breast.  Changes from previous surgery - left breast.  Minimal tenderness - lower and lateral left breast.  No nipple discharge or nipple retraction present.  Could not appreciate any distinct nodules or axillary adenopathy.   Abdominal:     General: Bowel sounds are normal.     Palpations: Abdomen is soft.     Tenderness: There is no abdominal tenderness.  Musculoskeletal:        General: No swelling or tenderness.     Cervical back: Neck supple. No tenderness.  Lymphadenopathy:     Cervical: No cervical adenopathy.  Skin:    Findings: No erythema or rash.  Neurological:     Mental Status: She is alert.  Psychiatric:        Mood and Affect: Mood normal.        Behavior: Behavior normal.    BP 114/68   Pulse (!) 55   Temp (!) 96.3 F (35.7 C)   Ht 5' 2.01" (1.575 m)   Wt 192 lb 12.8 oz (87.5 kg)   SpO2 98%   BMI 35.25 kg/m  Wt Readings from Last 3 Encounters:  05/20/21 192 lb 12.8 oz (87.5 kg)  04/07/21 201 lb (91.2  kg)  03/08/21 201 lb (91.2 kg)    Outpatient Encounter Medications as of 05/20/2021  Medication Sig   anastrozole (ARIMIDEX) 1 MG tablet Take 1 mg by mouth daily.   aspirin 81 MG chewable tablet Chew 1 tablet by mouth daily.   FLOVENT HFA 110 MCG/ACT inhaler Inhale 1 puff into the lungs 2 (two) times daily.   levothyroxine (SYNTHROID) 100 MCG tablet TAKE 1 TABLET BY MOUTH EVERY DAY  lisinopril-hydrochlorothiazide (ZESTORETIC) 20-25 MG tablet Take 1 tablet by mouth daily.   metFORMIN (GLUCOPHAGE) 500 MG tablet TAKE 1 TABLET BY MOUTH EVERY DAY   rosuvastatin (CRESTOR) 20 MG tablet TAKE 1 TABLET BY MOUTH EVERY DAY   sertraline (ZOLOFT) 50 MG tablet TAKE 1 TABLET (50 MG TOTAL) BY MOUTH IN THE MORNING AND AT BEDTIME.   No facility-administered encounter medications on file as of 05/20/2021.     Lab Results  Component Value Date   WBC 5.2 05/20/2021   HGB 12.3 05/20/2021   HCT 37.4 05/20/2021   PLT 160.0 05/20/2021   GLUCOSE 107 (H) 05/20/2021   CHOL 148 05/20/2021   TRIG 118.0 05/20/2021   HDL 56.10 05/20/2021   LDLCALC 69 05/20/2021   ALT 20 05/20/2021   AST 19 05/20/2021   NA 139 05/20/2021   K 3.5 05/20/2021   CL 102 05/20/2021   CREATININE 1.10 05/20/2021   BUN 19 05/20/2021   CO2 29 05/20/2021   TSH 1.42 05/20/2021   HGBA1C 6.6 (H) 12/30/2020       Assessment & Plan:   Problem List Items Addressed This Visit     DM type 2 with diabetic dyslipidemia ([place])    Low carb diet and exercise.  Continue metformin.  Follow met b and a1c.  Lab Results  Component Value Date   HGBA1C 6.6 (H) 12/30/2020       Fasciculations    Saw neurology.  Declines NCS at this time.  Discussed.  Labs unrevealing except for m-spike - .6 noted.  Discussed f/u with hematology.  She is seeing her oncologist next week. Plans to discuss.  Will forward information.  Muscle fasciculation have improved.  Follow.         Health care maintenance    Physical today 05/20/21.  PAP of vaginal cuff  performed (05/20/21).  Mammogram being followed through oncology.  Need to obtain colonoscopy report.         Relevant Orders   Cytology - PAP( [facility])   Hypertension    Blood pressure as outlined.  Continue lisinopril/hctz.  Follow pressures.  Follow metabolic panel.        Relevant Orders   CBC with Differential/Platelet (Completed)   Lipid panel (Completed)   Hepatic function panel (Completed)   Basic metabolic panel (Completed)   Hypothyroid    On thyroid replacement.  Follow tsh.        Relevant Orders   TSH (Completed)   Mild depression (HCC)    Increased stress recently as outlined.  Discussed.  Stable.  Continue zoloft.  No changes today.  Follow.        OSA (obstructive sleep apnea)    Continue cpap.        Palpitations    Not reported as a significant issue today.  Denies any palpitations now.  Saw cardiology.  Continue risk factor modifications.        Other Visit Diagnoses     Routine general medical examination at a health care facility    -  Primary        Einar Pheasant, MD

## 2021-05-20 NOTE — Assessment & Plan Note (Addendum)
Physical today 05/20/21.  PAP of vaginal cuff performed (05/20/21).  Mammogram being followed through oncology.  Need to obtain colonoscopy report.

## 2021-05-21 ENCOUNTER — Encounter: Payer: Self-pay | Admitting: Internal Medicine

## 2021-05-21 ENCOUNTER — Telehealth: Payer: Self-pay | Admitting: Internal Medicine

## 2021-05-21 NOTE — Assessment & Plan Note (Signed)
Increased stress recently as outlined.  Discussed.  Stable.  Continue zoloft.  No changes today.  Follow.

## 2021-05-21 NOTE — Assessment & Plan Note (Signed)
Continue cpap.  

## 2021-05-21 NOTE — Telephone Encounter (Signed)
Need to forward Dr Trena Platt labs (specifically the IFE and PE, serum from 04/11/21 - in [place]).  Forward to Dr Richmond Campbell hematology [facility].  Phone number [phone] and fax [phone] (I believe).  Please inform them labs are coming.  She has an appt with him Tuesday and I want him to have this information and my office note from 05/20/21.  Also, need to know if she has had colonoscopy.  If so, need colonoscopy report.  Thanks.

## 2021-05-21 NOTE — Assessment & Plan Note (Addendum)
Saw neurology.  Declines NCS at this time.  Discussed.  Labs unrevealing except for m-spike - .6 noted.  Discussed f/u with hematology.  She is seeing her oncologist next week. Plans to discuss.  Will forward information.  Muscle fasciculation have improved.  Follow.

## 2021-05-21 NOTE — Assessment & Plan Note (Signed)
On thyroid replacement.  Follow tsh.  

## 2021-05-21 NOTE — Assessment & Plan Note (Signed)
Not reported as a significant issue today.  Denies any palpitations now.  Saw cardiology.  Continue risk factor modifications.

## 2021-05-21 NOTE — Assessment & Plan Note (Signed)
Low carb diet and exercise.  Continue metformin.  Follow met b and a1c.   Lab Results  Component Value Date   HGBA1C 6.6 (H) 12/30/2020   

## 2021-05-21 NOTE — Assessment & Plan Note (Signed)
Blood pressure as outlined.  Continue lisinopril/hctz.  Follow pressures.  Follow metabolic panel.

## 2021-05-23 NOTE — Telephone Encounter (Signed)
Called and spoke with receptionist at Dr Joline Maxcy office and left message for nurse line. Confirmed fax number with receptionist and sent over Dr Trena Platt labs from 04/11/21 and also 05/20/21 office note. I let the receptionist know that the patient has appt tomorrow and left this info in the message for the nurse. Received confirmation that fax went through. Spoke with patient to confirm that she did have a colonoscopy done in [place] with Dr Francis Gaines. Requested records from their office.

## 2021-05-24 DIAGNOSIS — Z170 Estrogen receptor positive status [ER+]: Secondary | ICD-10-CM | POA: Diagnosis not present

## 2021-05-24 DIAGNOSIS — C50412 Malignant neoplasm of upper-outer quadrant of left female breast: Secondary | ICD-10-CM | POA: Diagnosis not present

## 2021-05-24 DIAGNOSIS — D472 Monoclonal gammopathy: Secondary | ICD-10-CM | POA: Diagnosis not present

## 2021-05-24 LAB — CYTOLOGY - PAP
Comment: NEGATIVE
Diagnosis: NEGATIVE
High risk HPV: NEGATIVE

## 2021-05-28 ENCOUNTER — Other Ambulatory Visit: Payer: Self-pay | Admitting: Internal Medicine

## 2021-06-03 DIAGNOSIS — D2261 Melanocytic nevi of right upper limb, including shoulder: Secondary | ICD-10-CM | POA: Diagnosis not present

## 2021-06-03 DIAGNOSIS — D2272 Melanocytic nevi of left lower limb, including hip: Secondary | ICD-10-CM | POA: Diagnosis not present

## 2021-06-03 DIAGNOSIS — D225 Melanocytic nevi of trunk: Secondary | ICD-10-CM | POA: Diagnosis not present

## 2021-06-03 DIAGNOSIS — D2262 Melanocytic nevi of left upper limb, including shoulder: Secondary | ICD-10-CM | POA: Diagnosis not present

## 2021-06-07 ENCOUNTER — Telehealth: Payer: Self-pay | Admitting: Internal Medicine

## 2021-06-07 DIAGNOSIS — D472 Monoclonal gammopathy: Secondary | ICD-10-CM | POA: Diagnosis not present

## 2021-06-07 DIAGNOSIS — Z853 Personal history of malignant neoplasm of breast: Secondary | ICD-10-CM | POA: Diagnosis not present

## 2021-06-07 NOTE — Telephone Encounter (Signed)
Received notice from [facility]. Please confirm with pt that she is taking her blood pressure medication daily.

## 2021-06-07 NOTE — Telephone Encounter (Signed)
-----   Message from De Hollingshead, [place] sent at 06/01/2021  3:40 PM EDT ----- You don't see this patient until December.   She's flagging on the "may fail adherence measure" report for her lisinopril/HCTZ. Here is her most recent fill history  LISINOPRIL-HCTZ 20/'25MG'$  TABLETS 04/04/2021 90 90 each Einar Pheasant, MD LISINOPRIL-HCTZ 20/'25MG'$  TABLETS 11/19/2020 90 90 each Einar Pheasant, MD  If you think a CCM referral for medication support would be appropriate, do feel free to place it!  [facility]

## 2021-06-07 NOTE — Telephone Encounter (Signed)
Spoke with pt and she stated that she is taking her blood pressure medication daily. Pt stated that she is taking the lisinopril-hctz.

## 2021-06-09 ENCOUNTER — Other Ambulatory Visit: Payer: Self-pay | Admitting: Internal Medicine

## 2021-06-20 ENCOUNTER — Encounter: Payer: Self-pay | Admitting: Internal Medicine

## 2021-06-21 ENCOUNTER — Other Ambulatory Visit: Payer: Self-pay

## 2021-06-21 MED ORDER — SERTRALINE HCL 50 MG PO TABS: 50.0000 mg | ORAL_TABLET | Freq: Two times a day (BID) | ORAL | 1 refills | Status: DC

## 2021-06-21 MED ORDER — METFORMIN HCL 500 MG PO TABS: 500.0000 mg | ORAL_TABLET | Freq: Every day | ORAL | 1 refills | Status: DC

## 2021-06-21 MED ORDER — LISINOPRIL-HYDROCHLOROTHIAZIDE 20-25 MG PO TABS: 1.0000 | ORAL_TABLET | Freq: Every day | ORAL | 1 refills | Status: DC

## 2021-06-27 ENCOUNTER — Telehealth: Payer: Self-pay | Admitting: Pulmonary Disease

## 2021-06-27 ENCOUNTER — Encounter: Payer: Self-pay | Admitting: Internal Medicine

## 2021-06-27 NOTE — Telephone Encounter (Signed)
I called and spoke with patient regarding message. Patient stated [facility] never got a script for CPAP but looks like Dr. Halford Chessman sent one in April 2022. [facility] can also see the charts so I will send them a community message to see what is needed as the patient has been waiting a while for a new machine. Will leave in triage to follow up from [place].

## 2021-06-28 ENCOUNTER — Other Ambulatory Visit: Payer: Self-pay | Admitting: Internal Medicine

## 2021-06-28 ENCOUNTER — Other Ambulatory Visit: Payer: Self-pay

## 2021-06-28 MED ORDER — SERTRALINE HCL 100 MG PO TABS: 100.0000 mg | ORAL_TABLET | Freq: Every day | ORAL | 1 refills | Status: DC

## 2021-06-28 NOTE — Telephone Encounter (Signed)
I am ok if she takes '100mg'$  q day.  Please call and confirm she is ok with this change and then can send in rx with new directions.

## 2021-06-28 NOTE — Telephone Encounter (Signed)
Pt agreeable with this. Sent in new rx for sertraline 100 mg q day.

## 2021-07-05 DIAGNOSIS — I10 Essential (primary) hypertension: Secondary | ICD-10-CM | POA: Diagnosis not present

## 2021-07-05 DIAGNOSIS — G4733 Obstructive sleep apnea (adult) (pediatric): Secondary | ICD-10-CM | POA: Diagnosis not present

## 2021-07-05 DIAGNOSIS — F32A Depression, unspecified: Secondary | ICD-10-CM | POA: Diagnosis not present

## 2021-07-05 DIAGNOSIS — R0683 Snoring: Secondary | ICD-10-CM | POA: Diagnosis not present

## 2021-07-06 DIAGNOSIS — R0683 Snoring: Secondary | ICD-10-CM | POA: Diagnosis not present

## 2021-07-06 DIAGNOSIS — F32A Depression, unspecified: Secondary | ICD-10-CM | POA: Diagnosis not present

## 2021-07-06 DIAGNOSIS — G4733 Obstructive sleep apnea (adult) (pediatric): Secondary | ICD-10-CM | POA: Diagnosis not present

## 2021-07-06 DIAGNOSIS — I10 Essential (primary) hypertension: Secondary | ICD-10-CM | POA: Diagnosis not present

## 2021-07-08 NOTE — Telephone Encounter (Signed)
I called to make a follow up for this patietn for CPAP compliance and she needs to be seen in the office between 08/05/21 and 10/03/21 with Dr. Halford Chessman. Patient will call back to make a follow up.

## 2021-07-17 ENCOUNTER — Encounter: Payer: Self-pay | Admitting: Internal Medicine

## 2021-07-20 NOTE — Telephone Encounter (Signed)
May be able to do 07/29/21 - at 12:00 if not meeting, etc.

## 2021-07-20 NOTE — Telephone Encounter (Signed)
Can you look at your schedule for next week and see if there is a time that we can work her in virtually. We tried to work her in yesterday but was unable to reach patient

## 2021-07-21 NOTE — Telephone Encounter (Signed)
You have a meeting 10/14 during lunch. I can schedule her 10/12 at 7:30. I do not see where we have anything else next week

## 2021-07-21 NOTE — Telephone Encounter (Signed)
Appt made.   Nothing further needed at this time.  

## 2021-07-21 NOTE — Telephone Encounter (Signed)
I am ok to schedule her at 7:30 (can have her come around 7:15) - if the person we had discussed putting in the 7:30 spot is not able to keep appt.

## 2021-07-22 NOTE — Telephone Encounter (Signed)
Patient has been scheduled and is doing a virtual visit 10/12 at 7:30

## 2021-07-25 DIAGNOSIS — N6489 Other specified disorders of breast: Secondary | ICD-10-CM | POA: Diagnosis not present

## 2021-07-25 DIAGNOSIS — C50412 Malignant neoplasm of upper-outer quadrant of left female breast: Secondary | ICD-10-CM | POA: Diagnosis not present

## 2021-07-25 DIAGNOSIS — Z170 Estrogen receptor positive status [ER+]: Secondary | ICD-10-CM | POA: Diagnosis not present

## 2021-07-27 ENCOUNTER — Telehealth (INDEPENDENT_AMBULATORY_CARE_PROVIDER_SITE_OTHER): Payer: Medicare Other | Admitting: Internal Medicine

## 2021-07-27 DIAGNOSIS — R253 Fasciculation: Secondary | ICD-10-CM | POA: Diagnosis not present

## 2021-07-27 DIAGNOSIS — R252 Cramp and spasm: Secondary | ICD-10-CM

## 2021-07-27 DIAGNOSIS — F419 Anxiety disorder, unspecified: Secondary | ICD-10-CM

## 2021-07-27 DIAGNOSIS — I10 Essential (primary) hypertension: Secondary | ICD-10-CM

## 2021-07-27 DIAGNOSIS — F32A Depression, unspecified: Secondary | ICD-10-CM | POA: Diagnosis not present

## 2021-07-27 MED ORDER — MAGNESIUM OXIDE -MG SUPPLEMENT 400 (240 MG) MG PO TABS: 400.0000 mg | ORAL_TABLET | Freq: Every day | ORAL | 2 refills | Status: DC

## 2021-07-27 NOTE — Progress Notes (Signed)
Patient ID: Tonya Greer, female   DOB: 1956-09-24, 65 y.o.   MRN: [id number]   Virtual Visit via video Note  This visit type was conducted due to national recommendations for restrictions regarding the COVID-19 pandemic (e.g. social distancing).  This format is felt to be most appropriate for this patient at this time.  All issues noted in this document were discussed and addressed.  No physical exam was performed (except for noted visual exam findings with Video Visits).   I connected with Tonya Greer by a video enabled telemedicine application and verified that I am speaking with the correct person using two identifiers. Location patient: home Location provider: work Persons participating in the virtual visit: patient, provider  Te limitations, risks, security and privacy concerns of performing an evaluation and management service by video and the availability of in person appointments have been discussed.  It has also been discussed with the patient that there may be a patient responsible charge related to this service. The patient expressed understanding and agreed to proceed.   Reason for visit: work in appt  HPI: Increased stress and anxiety recently - related her her current medical issues and testing.  On zoloft. Recently increased her zoloft to 150mg  q day.  Has been on this dose for three days.  She feels this is helping.  She is able to sleep.  Is watching her diet and has adjusted her diet.  Has been trying to lose weight.  Has lost 24 pounds.  Drinking water.  Watching sugar intake.  Still having muscle twitching.  Saw neurology.  Discussed visit and further w/up.  Discussed NCS.  She is going to establish with a counselor at [facility].  Just received notification that her mammogram was ok.     ROS: See pertinent positives and negatives per HPI.  Past Medical History:  Diagnosis Date   Depression    Diabetes mellitus without complication ([place])    History of carpal tunnel syndrome     Hyperlipidemia    Hypertension    Hypothyroidism    OSA (obstructive sleep apnea)     Past Surgical History:  Procedure Laterality Date   ABDOMINAL HYSTERECTOMY     ovaries left in place   brreast biopsy     FOOT SURGERY     TONSILLECTOMY AND ADENOIDECTOMY      Family History  Problem Relation Age of Onset   Heart disease Mother     SOCIAL HX: reviewed.    Current Outpatient Medications:    magnesium oxide (MAG-OX) 400 (240 Mg) MG tablet, Take 1 tablet (400 mg total) by mouth daily., Disp: 30 tablet, Rfl: 2   anastrozole (ARIMIDEX) 1 MG tablet, Take 1 mg by mouth daily., Disp: , Rfl:    aspirin 81 MG chewable tablet, Chew 1 tablet by mouth daily., Disp: , Rfl:    levothyroxine (SYNTHROID) 100 MCG tablet, TAKE 1 TABLET BY MOUTH EVERY DAY, Disp: 90 tablet, Rfl: 1   lisinopril-hydrochlorothiazide (ZESTORETIC) 20-25 MG tablet, Take 1 tablet by mouth daily., Disp: 90 tablet, Rfl: 1   metFORMIN (GLUCOPHAGE) 500 MG tablet, TAKE 1 TABLET BY MOUTH EVERY DAY, Disp: 90 tablet, Rfl: 1   rosuvastatin (CRESTOR) 20 MG tablet, TAKE 1 TABLET BY MOUTH EVERY DAY, Disp: 90 tablet, Rfl: 1   sertraline (ZOLOFT) 100 MG tablet, Take 1 tablet (100 mg total) by mouth daily., Disp: 90 tablet, Rfl: 1  EXAM:  GENERAL: alert, oriented, appears well and in no acute distress  HEENT: atraumatic,  conjunttiva clear, no obvious abnormalities on inspection of external nose and ears  NECK: normal movements of the head and neck  LUNGS: on inspection no signs of respiratory distress, breathing rate appears normal, no obvious gross SOB, gasping or wheezing  CV: no obvious cyanosis  PSYCH/NEURO: pleasant and cooperative, no obvious depression or anxiety, speech and thought processing grossly intact  ASSESSMENT AND PLAN:  Discussed the following assessment and plan:  Problem List Items Addressed This Visit     Anxiety    Increased stress and anxiety as outlined.  Discussed.  On zoloft 150mg  q day now.   Just started this dose a few days ago.  Feeling some better.  Planning to see a counselor 08/02/21.  Hold on making further changes in medication at this time - given has just started increased dose of zoloft.  Follow.       Fasciculations    Saw neurology.  Discussed visit.  Discussed NCS.        Hypertension    Blood pressure as outlined.  Continue lisinopril/hctz.  Follow pressures.  Follow metabolic panel.       Leg cramps    Trial of magnesium.  Stretches.  Follow.        Mild depression    On zoloft.  See below. Planning to f/u with counselor 08/02/21.         Return in about 4 weeks (around 08/24/2021) for follow up appt (4min).   I discussed the assessment and treatment plan with the patient. The patient was provided an opportunity to ask questions and all were answered. The patient agreed with the plan and demonstrated an understanding of the instructions.   The patient was advised to call back or seek an in-person evaluation if the symptoms worsen or if the condition fails to improve as anticipated.    Einar Pheasant, MD

## 2021-07-30 ENCOUNTER — Encounter: Payer: Self-pay | Admitting: Internal Medicine

## 2021-07-30 DIAGNOSIS — F419 Anxiety disorder, unspecified: Secondary | ICD-10-CM | POA: Insufficient documentation

## 2021-07-30 NOTE — Assessment & Plan Note (Signed)
Increased stress and anxiety as outlined.  Discussed.  On zoloft 150mg  q day now.  Just started this dose a few days ago.  Feeling some better.  Planning to see a counselor 08/02/21.  Hold on making further changes in medication at this time - given has just started increased dose of zoloft.  Follow.

## 2021-07-30 NOTE — Assessment & Plan Note (Signed)
Saw neurology.  Discussed visit.  Discussed NCS.

## 2021-07-30 NOTE — Assessment & Plan Note (Signed)
On zoloft.  See below. Planning to f/u with counselor 08/02/21.

## 2021-07-30 NOTE — Assessment & Plan Note (Signed)
Trial of magnesium.  Stretches.  Follow.

## 2021-07-30 NOTE — Assessment & Plan Note (Signed)
Blood pressure as outlined.  Continue lisinopril/hctz.  Follow pressures.  Follow metabolic panel.

## 2021-08-04 DIAGNOSIS — R0683 Snoring: Secondary | ICD-10-CM | POA: Diagnosis not present

## 2021-08-04 DIAGNOSIS — I10 Essential (primary) hypertension: Secondary | ICD-10-CM | POA: Diagnosis not present

## 2021-08-04 DIAGNOSIS — G4733 Obstructive sleep apnea (adult) (pediatric): Secondary | ICD-10-CM | POA: Diagnosis not present

## 2021-08-04 DIAGNOSIS — F32A Depression, unspecified: Secondary | ICD-10-CM | POA: Diagnosis not present

## 2021-08-05 DIAGNOSIS — I10 Essential (primary) hypertension: Secondary | ICD-10-CM | POA: Diagnosis not present

## 2021-08-05 DIAGNOSIS — R0683 Snoring: Secondary | ICD-10-CM | POA: Diagnosis not present

## 2021-08-05 DIAGNOSIS — G4733 Obstructive sleep apnea (adult) (pediatric): Secondary | ICD-10-CM | POA: Diagnosis not present

## 2021-08-05 DIAGNOSIS — F32A Depression, unspecified: Secondary | ICD-10-CM | POA: Diagnosis not present

## 2021-08-16 ENCOUNTER — Telehealth: Payer: Self-pay | Admitting: Pulmonary Disease

## 2021-08-16 NOTE — Telephone Encounter (Signed)
LMTCB  VS please advise if okay if appt 11/4 can be a video visit. Patient only wants to be seen in [place] office. Can do video visit on 11/4 and make all other follow ups in [place] office. Thanks :)

## 2021-08-17 NOTE — Telephone Encounter (Signed)
Okay to change to video visit?

## 2021-08-17 NOTE — Telephone Encounter (Signed)
Called and spoke with pt letting her know that VS was fine with [place] changing her visit to a video visit and she verbalized understanding. Appt type has been changed. Nothing further needed.

## 2021-08-18 ENCOUNTER — Encounter: Payer: Self-pay | Admitting: Internal Medicine

## 2021-08-18 DIAGNOSIS — Z713 Dietary counseling and surveillance: Secondary | ICD-10-CM

## 2021-08-19 ENCOUNTER — Telehealth (INDEPENDENT_AMBULATORY_CARE_PROVIDER_SITE_OTHER): Payer: Medicare Other | Admitting: Pulmonary Disease

## 2021-08-19 ENCOUNTER — Encounter: Payer: Self-pay | Admitting: Pulmonary Disease

## 2021-08-19 DIAGNOSIS — J45991 Cough variant asthma: Secondary | ICD-10-CM | POA: Diagnosis not present

## 2021-08-19 DIAGNOSIS — G4733 Obstructive sleep apnea (adult) (pediatric): Secondary | ICD-10-CM

## 2021-08-19 NOTE — Telephone Encounter (Signed)
I am glad she is feeling better.  Continue zoloft as she is doing.  See if agreeable to referral to [facility] for weight loss.

## 2021-08-19 NOTE — Progress Notes (Signed)
[facility]  Chief Complaint  Patient presents with   Follow-up    F/U on cpap. States the machine is working well and denies any issues with mask.     Past Surgical History:  She  has a past surgical history that includes Abdominal hysterectomy; Foot surgery; Tonsillectomy and adenoidectomy; and brreast biopsy.  Past Medical History:  Depression, DM, Carpal tunnel, HLD, HTN, Hypothyroidism, Lt breast cancer, CAD  Constitutional:  There were no vitals taken for this visit.  Brief Summary:  Tonya Greer is a 65 y.o. female with obstructive sleep apnea.      Subjective:  Virtual Visit via Video Note  I connected with Tonya Greer on 08/19/21 at  9:30 AM EDT by a video enabled telemedicine application and verified that I am speaking with the correct person using two identifiers.  Location: Patient: home Provider: medical office   I discussed the limitations of evaluation and management by telemedicine and the availability of in person appointments. The patient expressed understanding and agreed to proceed.  History of Present Illness:  She has been doing well with CPAP.  Using nasal pillows mask.  No issue with mask leak, sinus congestion or dry mouth.  Not having cough, wheeze, or chest congestion.  Hasn't needed to use flovent this season.  Started on alpha lipoic acid and has helped leg cramps.    Physical Exam:      Pulmonary Tests:    Sleep Tests:  PSG 02/17/15 >> AHI 10, SpO2 low 85.6% CPAP 07/19/21 to 08/17/21 >> used on 27 of 30 nights with average 9 hrs 15 min.  Average AHI 2.8 with CPAP 8 cm H2O  Social History:  She  reports that she has never smoked. She has never used smokeless tobacco. She reports that she does not use drugs.  Family History:  Her family history includes Heart disease in her mother.     Assessment/Plan:   Obstructive sleep apnea. - she is compliant with CPAP and reports benefit from  therapy - she uses [employer] in [place] for her DME - continue CPAP 8 cm H2O  Cough variant asthma. - prn flovent  Coronary artery disease. - followed by Dr. Marney Setting with [facility]  Left breast cancer.  - followed [facility] Dr. Leone Haven with [facility]  Obesity. - discussed how weight can impact sleep and risk for sleep disordered breathing - discussed options to assist with weight loss: combination of diet modification, cardiovascular and strength training exercises  Time Spent Involved in Patient Care on Day of Examination:  23 minutes  Follow up:   Patient Instructions  Follow up in 1 year in [place]  Medication List:   Allergies as of 08/19/2021       Reactions   Hydrocodone Itching   Oxycodone Itching        Medication List        Accurate as of August 19, 2021  9:37 AM. If you have any questions, ask your nurse or doctor.          anastrozole 1 MG tablet Commonly known as: ARIMIDEX Take 1 mg by mouth daily.   aspirin 81 MG chewable tablet Chew 1 tablet by mouth daily.   levothyroxine 100 MCG tablet Commonly known as: SYNTHROID TAKE 1 TABLET BY MOUTH EVERY DAY   lisinopril-hydrochlorothiazide 20-25 MG tablet Commonly known as: ZESTORETIC Take 1 tablet by mouth daily.   magnesium oxide 400 (240 Mg) MG tablet  Commonly known as: MAG-OX Take 1 tablet (400 mg total) by mouth daily.   metFORMIN 500 MG tablet Commonly known as: GLUCOPHAGE TAKE 1 TABLET BY MOUTH EVERY DAY   rosuvastatin 20 MG tablet Commonly known as: CRESTOR TAKE 1 TABLET BY MOUTH EVERY DAY   sertraline 100 MG tablet Commonly known as: ZOLOFT Take 1 tablet (100 mg total) by mouth daily.        Signature:  Chesley Mires, MD [facility] Pager - [phone] 08/19/2021, 9:37 AM

## 2021-08-19 NOTE — Patient Instructions (Addendum)
Follow up in 1 year in [facility]

## 2021-08-20 NOTE — Telephone Encounter (Signed)
CCM referral placed °

## 2021-08-23 ENCOUNTER — Telehealth: Payer: Self-pay

## 2021-08-23 NOTE — Chronic Care Management (AMB) (Signed)
  Chronic Care Management   Outreach Note  08/23/2021 Name: Marisol Giambra MRN: [id number] DOB: 05/16/1956  Maycie Luera is a 65 y.o. year old female who is a primary care patient of Einar Pheasant, MD. I reached out to [name] by phone today in response to a referral sent by Ms. Silvano Rusk primary care provider.  An unsuccessful telephone outreach was attempted today. The patient was referred to the case management team for assistance with care management and care coordination.   Follow Up Plan: A HIPAA compliant phone message was left for the patient providing contact information and requesting a return call.  The care management team will reach out to the patient again over the next 7 days.  If patient returns call to provider office, please advise to call [place] at [place], [place], [place], [place] [address] Direct Dial: [phone] [email or web address]

## 2021-08-24 NOTE — Chronic Care Management (AMB) (Signed)
  Chronic Care Management   Note  08/24/2021 Name: Shantea Poulton MRN: [id number] DOB: [birth year]  Sharnese Heath is a 65 y.o. year old female who is a primary care patient of Einar Pheasant, MD. I reached out to [name] by phone today in response to a referral sent by Ms. Silvano Rusk PCP.  Ms. Rabelo was given information about Chronic Care Management services today including:  CCM service includes personalized support from designated clinical staff supervised by her physician, including individualized plan of care and coordination with other care providers 24/7 contact phone numbers for assistance for urgent and routine care needs. Service will only be billed when office clinical staff spend 20 minutes or more in a month to coordinate care. Only one practitioner may furnish and bill the service in a calendar month. The patient may stop CCM services at any time (effective at the end of the month) by phone call to the office staff. The patient is responsible for co-pay (up to 20% after annual deductible is met) if co-pay is required by the individual health plan.   Patient agreed to services and verbal consent obtained.   Follow up plan: Telephone appointment with care management team member scheduled for:09/05/2021  Noreene Larsson, [place], [place], [place] [address] Direct Dial: [phone] [email or web address]

## 2021-09-04 DIAGNOSIS — F32A Depression, unspecified: Secondary | ICD-10-CM | POA: Diagnosis not present

## 2021-09-04 DIAGNOSIS — I10 Essential (primary) hypertension: Secondary | ICD-10-CM | POA: Diagnosis not present

## 2021-09-04 DIAGNOSIS — R0683 Snoring: Secondary | ICD-10-CM | POA: Diagnosis not present

## 2021-09-04 DIAGNOSIS — G4733 Obstructive sleep apnea (adult) (pediatric): Secondary | ICD-10-CM | POA: Diagnosis not present

## 2021-09-05 ENCOUNTER — Ambulatory Visit (INDEPENDENT_AMBULATORY_CARE_PROVIDER_SITE_OTHER): Payer: Medicare Other | Admitting: Pharmacist

## 2021-09-05 DIAGNOSIS — E1169 Type 2 diabetes mellitus with other specified complication: Secondary | ICD-10-CM

## 2021-09-05 DIAGNOSIS — G4733 Obstructive sleep apnea (adult) (pediatric): Secondary | ICD-10-CM | POA: Diagnosis not present

## 2021-09-05 DIAGNOSIS — E785 Hyperlipidemia, unspecified: Secondary | ICD-10-CM

## 2021-09-05 DIAGNOSIS — I10 Essential (primary) hypertension: Secondary | ICD-10-CM | POA: Diagnosis not present

## 2021-09-05 DIAGNOSIS — R0683 Snoring: Secondary | ICD-10-CM | POA: Diagnosis not present

## 2021-09-05 DIAGNOSIS — F32A Depression, unspecified: Secondary | ICD-10-CM | POA: Diagnosis not present

## 2021-09-05 DIAGNOSIS — Z6832 Body mass index (BMI) 32.0-32.9, adult: Secondary | ICD-10-CM

## 2021-09-05 NOTE — Chronic Care Management (AMB) (Signed)
Chronic Care Management CCM Pharmacy Note  09/05/2021 Name:  Tonya Greer MRN:  [id number] DOB:  14-Nov-1955  Summary: - Lately, unable to achieve weight loss through diet and exercise alone  Recommendations/Changes made from today's visit: - Recommend starting therapy with Mounjaro. Will pursue coverage  Subjective: Tonya Greer is an 65 y.o. year old female who is a primary patient of Einar Pheasant, MD.  The CCM team was consulted for assistance with disease management and care coordination needs.    Engaged with patient by telephone for initial visit for pharmacy case management and/or care coordination services.   Objective:  Medications Reviewed Today     Reviewed by De Hollingshead, RPH-CPP (Pharmacist) on 09/05/21 at 1452  Med List Status: <None>   Medication Order Taking? Sig Documenting Provider Last Dose Status Informant  ALPHA LIPOIC ACID PO [id number] Yes Take 600 mg by mouth daily. [provider] Taking Active   anastrozole (ARIMIDEX) 1 MG tablet [id number] Yes Take 1 mg by mouth daily. [provider] Taking Active   aspirin 81 MG chewable tablet [id number] Yes Chew 1 tablet by mouth daily. [provider] Taking Active   CALCIUM CARB-CHOLECALCIFEROL PO [id number] Yes Take by mouth. [provider] Taking Active   levothyroxine (SYNTHROID) 100 MCG tablet [id number] Yes TAKE 1 TABLET BY MOUTH EVERY DAY Einar Pheasant, MD Taking Active   lisinopril-hydrochlorothiazide (ZESTORETIC) 20-25 MG tablet [id number] Yes Take 1 tablet by mouth daily. Einar Pheasant, MD Taking Active   magnesium oxide (MAG-OX) 400 (240 Mg) MG tablet [id number] Yes Take 1 tablet (400 mg total) by mouth daily. Einar Pheasant, MD Taking Active   metFORMIN (GLUCOPHAGE) 500 MG tablet [id number] Yes TAKE 1 TABLET BY MOUTH EVERY DAY Einar Pheasant, MD Taking Active   rosuvastatin (CRESTOR) 20 MG tablet [id number] Yes TAKE 1 TABLET BY MOUTH EVERY DAY Einar Pheasant,  MD Taking Active   sertraline (ZOLOFT) 100 MG tablet [id number] Yes Take 1 tablet (100 mg total) by mouth daily.  Patient taking differently: Take 150 mg by mouth daily.   Einar Pheasant, MD Taking Active   vitamin B-12 (CYANOCOBALAMIN) 1000 MCG tablet [id number] Yes Take 5,000 mcg by mouth daily. [provider] Taking Active             Pertinent Labs:   Lab Results  Component Value Date   HGBA1C 6.6 (H) 12/30/2020   Lab Results  Component Value Date   CHOL 148 05/20/2021   HDL 56.10 05/20/2021   LDLCALC 69 05/20/2021   TRIG 118.0 05/20/2021   CHOLHDL 3 05/20/2021   Lab Results  Component Value Date   CREATININE 1.10 05/20/2021   BUN 19 05/20/2021   NA 139 05/20/2021   K 3.5 05/20/2021   CL 102 05/20/2021   CO2 29 05/20/2021    SDOH:  (Social Determinants of Health) assessments and interventions performed:  SDOH Interventions    Flowsheet Row Most Recent Value  SDOH Interventions   Financial Strain Interventions Intervention Not Indicated       CCM Care Plan  Review of patient past medical history, allergies, medications, health status, including review of consultants reports, laboratory and other test data, was performed as part of comprehensive evaluation and provision of chronic care management services.   Care Plan : Medication Management  Updates made by De Hollingshead, RPH-CPP since 09/05/2021 12:00 AM     Problem: Diabetes, Obesity      Long-Range Goal: Disease Progression Prevention   Start Date: 09/05/2021  This Visit's Progress: On track  Priority: High  Note:   Current Barriers:  Unable to achieve control of weight   Pharmacist Clinical Goal(s):  patient will achieve improvement in weight through collaboration with PharmD and provider.    Interventions: 1:1 collaboration with Einar Pheasant, MD regarding development and update of comprehensive plan of care as evidenced by provider attestation and  co-signature Inter-disciplinary care team collaboration (see longitudinal plan of care) Comprehensive medication review performed; medication list updated in electronic medical record  Overweight/Obesity Complicated by Diabetes:   Last A1c controlled, but unable to achieve goal weight loss through lifestyle modification alone; current treatment: metformin 500 mg daily;  Baseline weight: 176 lbs Current meal patterns: breakfast: coffee; lunch: [employer], no bread, slaw; sometimes chickfila w/ chicken strips, salads; tries to avoid carbs/sugars; dinner: if she skips lunch, she'll eat dinner; snacks: not lately ; drinks: focusing on drinking more water, coffee in the mornings Current exercise: little exercise recently, but plans to increase Extensive dietary counseling including education on focus on lean proteins, fruits and vegetables, whole grains and increased fiber consumption, adequate hydration Extensive exercise counseling including eventual goal of 150 minutes of moderate intensity exercise weekly Counseled on GLP1 agonists, including mechanism of action, side effects, and benefits. No personal or family history of medullary thyroid cancer, personal history of pancreatitis or gallbladder disease. Counseled on potential side effects of nausea, stomach upset, queasiness, constipation, and that these generally improve over time. Advised to contact our office with more severe symptoms, including nausea, diarrhea, stomach pain. Patient verbalized understanding Discussed Mounjaro d/t access limitations of Ozempic and superior weight and A1c reduction than other incretin agents. Will pursue coverage of Mounjaro. PA completed today  Hypertension:   Controlled; current treatment: lisinopril/HCTZ 20/25 mg daily Recommended to continue current regimen at this time  Hyperlipidemia:   Controlled; current treatment: rosuvastatin 20 mg daily;  Recommended to continue current regimen at this  time  Patient Goals/Self-Care Activities patient will:  - target a minimum of 150 minutes of moderate intensity exercise weekly engage in dietary modifications by moderating portion sizes       Plan: Telephone follow up appointment with care management team member scheduled for:  pending medication access  Catie Darnelle Maffucci, PharmD, [place], CPP Clinical Pharmacist [employer] at [facility] [phone]

## 2021-09-05 NOTE — Patient Instructions (Signed)
Tonya Greer,   We will work on coverage for Mounjaro. I will let you know when I hear something from your insurance  I recommend setting an attainable weekly exercise goal.   Take care!  Catie Travis, PharmD  Visit Information   Following is a copy of your full care plan:  Care Plan : Medication Management  Updates made by Travis, Catherine E, RPH-CPP since 09/05/2021 12:00 AM     Problem: Diabetes, Obesity      Long-Range Goal: Disease Progression Prevention   Start Date: 09/05/2021  This Visit's Progress: On track  Priority: High  Note:   Current Barriers:  Unable to achieve control of weight   Pharmacist Clinical Goal(s):  patient will achieve improvement in weight through collaboration with PharmD and provider.    Interventions: 1:1 collaboration with Scott, Charlene, MD regarding development and update of comprehensive plan of care as evidenced by provider attestation and co-signature Inter-disciplinary care team collaboration (see longitudinal plan of care) Comprehensive medication review performed; medication list updated in electronic medical record  Overweight/Obesity Complicated by Diabetes:   Last A1c controlled, but unable to achieve goal weight loss through lifestyle modification alone; current treatment: metformin 500 mg daily;  Baseline weight: 176 lbs Current meal patterns: breakfast: coffee; lunch: [employer], no bread, slaw; sometimes chickfila w/ chicken strips, salads; tries to avoid carbs/sugars; dinner: if she skips lunch, she'll eat dinner; snacks: not lately ; drinks: focusing on drinking more water, coffee in the mornings Current exercise: little exercise recently, but plans to increase Extensive dietary counseling including education on focus on lean proteins, fruits and vegetables, whole grains and increased fiber consumption, adequate hydration Extensive exercise counseling including eventual goal of 150 minutes of moderate intensity exercise  weekly Counseled on GLP1 agonists, including mechanism of action, side effects, and benefits. No personal or family history of medullary thyroid cancer, personal history of pancreatitis or gallbladder disease. Counseled on potential side effects of nausea, stomach upset, queasiness, constipation, and that these generally improve over time. Advised to contact our office with more severe symptoms, including nausea, diarrhea, stomach pain. Patient verbalized understanding Discussed Mounjaro d/t access limitations of Ozempic and superior weight and A1c reduction than other incretin agents. Will pursue coverage of Mounjaro. PA completed today  Hypertension:   Controlled; current treatment: lisinopril/HCTZ 20/25 mg daily Recommended to continue current regimen at this time  Hyperlipidemia:   Controlled; current treatment: rosuvastatin 20 mg daily;  Recommended to continue current regimen at this time  Patient Goals/Self-Care Activities patient will:  - target a minimum of 150 minutes of moderate intensity exercise weekly engage in dietary modifications by moderating portion sizes      Consent to CCM Services: Tonya Greer was given information about Chronic Care Management services including:  CCM service includes personalized support from designated clinical staff supervised by her physician, including individualized plan of care and coordination with other care providers 24/7 contact phone numbers for assistance for urgent and routine care needs. Service will only be billed when office clinical staff spend 20 minutes or more in a month to coordinate care. Only one practitioner may furnish and bill the service in a calendar month. The patient may stop CCM services at any time (effective at the end of the month) by phone call to the office staff. The patient will be responsible for cost sharing (co-pay) of up to 20% of the service fee (after annual deductible is met).  Patient agreed to services  and verbal consent obtained.     Plan: Telephone follow up appointment with care management team member scheduled for:  pending medication access   Catie Darnelle Maffucci, PharmD, [place], CPP Clinical Pharmacist [place] at [facility] [phone]   Please call the care guide team at [phone] if you need to cancel or reschedule your appointment.   Patient verbalizes understanding of instructions provided today and agrees to view in [place].

## 2021-09-06 ENCOUNTER — Encounter: Payer: Self-pay | Admitting: Internal Medicine

## 2021-09-06 ENCOUNTER — Ambulatory Visit: Payer: Medicare Other | Admitting: Pharmacist

## 2021-09-06 DIAGNOSIS — E1169 Type 2 diabetes mellitus with other specified complication: Secondary | ICD-10-CM

## 2021-09-06 DIAGNOSIS — Z6832 Body mass index (BMI) 32.0-32.9, adult: Secondary | ICD-10-CM

## 2021-09-06 MED ORDER — TIRZEPATIDE 5 MG/0.5ML ~~LOC~~ SOAJ: 5.0000 mg | SUBCUTANEOUS | 2 refills | Status: DC

## 2021-09-06 MED ORDER — TIRZEPATIDE 2.5 MG/0.5ML ~~LOC~~ SOAJ
2.5000 mg | SUBCUTANEOUS | 2 refills | Status: DC
Start: 2021-09-06 — End: 2021-10-20

## 2021-09-06 NOTE — Patient Instructions (Signed)
Amika,   Start Mounjaro 2.5 mg weekly for 4 weeks, then increase to 5 mg weekly. This medication may cause stomach upset, queasiness, or constipation, especially when first starting. This generally improves over time. Call our office if these symptoms occur and worsen, or if you have severe symptoms such as vomiting, diarrhea, or stomach pain.   Let us know if you have any questions or concerns. Take care!  Catie Darnelle Maffucci, PharmD   Visit Information  Following are the goals we discussed today:  Patient Goals/Self-Care Activities patient will:  - target a minimum of 150 minutes of moderate intensity exercise weekly engage in dietary modifications by moderating portion sizes          Plan: Telephone follow up appointment with care management team member scheduled for:  6 weeks   Catie Darnelle Maffucci, PharmD, [place], CPP Clinical Pharmacist [place] at [facility] [phone]   Please call the care guide team at [phone] if you need to cancel or reschedule your appointment.   Patient verbalizes understanding of instructions provided today and agrees to view in [place].

## 2021-09-06 NOTE — Chronic Care Management (AMB) (Signed)
Chronic Care Management CCM Pharmacy Note  09/06/2021 Name:  Tonya Greer MRN:  [id number] DOB:  01/20/1956  Summary: - PA approved for Mounjaro through 09/06/31  Recommendations/Changes made from today's visit: - Start Mounjaro 2.5 mg weekly for 4 weeks, then increase to 5 mg weekly  Subjective: Tonya Greer is an 65 y.o. year old female who is a primary patient of Einar Pheasant, MD.  The CCM team was consulted for assistance with disease management and care coordination needs.    Engaged with patient by telephone for  medication access  for pharmacy case management and/or care coordination services.   Objective:  Medications Reviewed Today     Reviewed by De Hollingshead, RPH-CPP (Pharmacist) on 09/05/21 at 1452  Med List Status: <None>   Medication Order Taking? Sig Documenting Provider Last Dose Status Informant  ALPHA LIPOIC ACID PO [id number] Yes Take 600 mg by mouth daily. [provider] Taking Active   anastrozole (ARIMIDEX) 1 MG tablet [id number] Yes Take 1 mg by mouth daily. [provider] Taking Active   aspirin 81 MG chewable tablet [id number] Yes Chew 1 tablet by mouth daily. [provider] Taking Active   CALCIUM CARB-CHOLECALCIFEROL PO [id number] Yes Take by mouth. [provider] Taking Active   levothyroxine (SYNTHROID) 100 MCG tablet [id number] Yes TAKE 1 TABLET BY MOUTH EVERY DAY Einar Pheasant, MD Taking Active   lisinopril-hydrochlorothiazide (ZESTORETIC) 20-25 MG tablet [id number] Yes Take 1 tablet by mouth daily. Einar Pheasant, MD Taking Active   magnesium oxide (MAG-OX) 400 (240 Mg) MG tablet [id number] Yes Take 1 tablet (400 mg total) by mouth daily. Einar Pheasant, MD Taking Active   metFORMIN (GLUCOPHAGE) 500 MG tablet [id number] Yes TAKE 1 TABLET BY MOUTH EVERY DAY Einar Pheasant, MD Taking Active   rosuvastatin (CRESTOR) 20 MG tablet [id number] Yes TAKE 1 TABLET BY MOUTH EVERY DAY Einar Pheasant, MD Taking  Active   sertraline (ZOLOFT) 100 MG tablet [id number] Yes Take 1 tablet (100 mg total) by mouth daily.  Patient taking differently: Take 150 mg by mouth daily.   Einar Pheasant, MD Taking Active   vitamin B-12 (CYANOCOBALAMIN) 1000 MCG tablet [id number] Yes Take 5,000 mcg by mouth daily. [provider] Taking Active             Pertinent Labs:   Wt Readings from Last 3 Encounters:  07/27/21 176 lb (79.8 kg)  05/20/21 192 lb 12.8 oz (87.5 kg)  04/07/21 201 lb (91.2 kg)    Lab Results  Component Value Date   HGBA1C 6.6 (H) 12/30/2020   Lab Results  Component Value Date   CHOL 148 05/20/2021   HDL 56.10 05/20/2021   LDLCALC 69 05/20/2021   TRIG 118.0 05/20/2021   CHOLHDL 3 05/20/2021   Lab Results  Component Value Date   CREATININE 1.10 05/20/2021   BUN 19 05/20/2021   NA 139 05/20/2021   K 3.5 05/20/2021   CL 102 05/20/2021   CO2 29 05/20/2021    SDOH:  (Social Determinants of Health) assessments and interventions performed:  SDOH Interventions    Flowsheet Row Most Recent Value  SDOH Interventions   Financial Strain Interventions Intervention Not Indicated       CCM Care Plan  Review of patient past medical history, allergies, medications, health status, including review of consultants reports, laboratory and other test data, was performed as part of comprehensive evaluation and provision of chronic care management services.   Care Plan : Medication Management  Updates made  by De Hollingshead, RPH-CPP since 09/06/2021 12:00 AM     Problem: Diabetes, Obesity      Long-Range Goal: Disease Progression Prevention   Start Date: 09/05/2021  Recent Progress: On track  Priority: High  Note:   Current Barriers:  Unable to achieve control of weight   Pharmacist Clinical Goal(s):  patient will achieve improvement in weight through collaboration with PharmD and provider.    Interventions: 1:1 collaboration with Einar Pheasant, MD regarding  development and update of comprehensive plan of care as evidenced by provider attestation and co-signature Inter-disciplinary care team collaboration (see longitudinal plan of care) Comprehensive medication review performed; medication list updated in electronic medical record  Overweight/Obesity Complicated by Diabetes:   Last A1c controlled, but unable to achieve goal weight loss through lifestyle modification alone; current treatment: metformin 500 mg daily;  Baseline weight: 176 lbs Current meal patterns: breakfast: coffee; lunch: [employer], no bread, slaw; sometimes chickfila w/ chicken strips, salads; tries to avoid carbs/sugars; dinner: if she skips lunch, she'll eat dinner; snacks: not lately ; drinks: focusing on drinking more water, coffee in the mornings Current exercise: little exercise recently, but plans to increase PA approved for Mounjaro through 09/05/22. Start Mounjaro 2.5 mg weekly for 4 weeks. Script sent to [facility]. After 4 weeks of 2.5 mg, increase to 5 mg weekly. Script sent to mail order. Follow up in 6 weeks.   Hypertension:   Controlled; current treatment: lisinopril/HCTZ 20/25 mg daily Recommended to continue current regimen at this time  Hyperlipidemia:   Controlled; current treatment: rosuvastatin 20 mg daily;  Recommended to continue current regimen at this time  Patient Goals/Self-Care Activities patient will:  - target a minimum of 150 minutes of moderate intensity exercise weekly engage in dietary modifications by moderating portion sizes       Plan: Telephone follow up appointment with care management team member scheduled for:  6 weeks  Catie Darnelle Maffucci, PharmD, [place], [place] Clinical Pharmacist [employer] at [facility] [phone]

## 2021-09-12 ENCOUNTER — Ambulatory Visit: Payer: Medicare Other | Admitting: Pharmacist

## 2021-09-12 ENCOUNTER — Encounter: Payer: Self-pay | Admitting: Internal Medicine

## 2021-09-12 DIAGNOSIS — E1169 Type 2 diabetes mellitus with other specified complication: Secondary | ICD-10-CM

## 2021-09-12 DIAGNOSIS — Z6832 Body mass index (BMI) 32.0-32.9, adult: Secondary | ICD-10-CM

## 2021-09-12 NOTE — Patient Instructions (Signed)
Visit Information  Following are the goals we discussed today:  Patient Goals/Self-Care Activities patient will:  - target a minimum of 150 minutes of moderate intensity exercise weekly engage in dietary modifications by moderating portion sizes          Plan: Telephone follow up appointment with care management team member scheduled for:  8 weeks   Catie Darnelle Maffucci, PharmD, [place], CPP Clinical Pharmacist [place] at [facility] [phone]     Please call the care guide team at [phone] if you need to cancel or reschedule your appointment.   Patient verbalizes understanding of instructions provided today and agrees to view in [place].

## 2021-09-12 NOTE — Chronic Care Management (AMB) (Signed)
Chronic Care Management CCM Pharmacy Note  09/12/2021 Name:  Olanna Percifield MRN:  [id number] DOB:  [birth year]  Summary: - Patient notified that [facility] needed clarification on script  Recommendations/Changes made from today's visit: - Patient to check with insurance to determine options for cheaper copay  Subjective: Erendira Crabtree is an 65 y.o. year old female who is a primary patient of Einar Pheasant, MD.  The CCM team was consulted for assistance with disease management and care coordination needs.    Care coordination  for  medication access  for pharmacy case management and/or care coordination services.   Objective:  Medications Reviewed Today     Reviewed by De Hollingshead, RPH-CPP (Pharmacist) on 09/05/21 at 1452  Med List Status: <None>   Medication Order Taking? Sig Documenting Provider Last Dose Status Informant  ALPHA LIPOIC ACID PO [id number] Yes Take 600 mg by mouth daily. [provider] Taking Active   anastrozole (ARIMIDEX) 1 MG tablet [id number] Yes Take 1 mg by mouth daily. [provider] Taking Active   aspirin 81 MG chewable tablet [id number] Yes Chew 1 tablet by mouth daily. [provider] Taking Active   CALCIUM CARB-CHOLECALCIFEROL PO [id number] Yes Take by mouth. [provider] Taking Active   levothyroxine (SYNTHROID) 100 MCG tablet [id number] Yes TAKE 1 TABLET BY MOUTH EVERY DAY Einar Pheasant, MD Taking Active   lisinopril-hydrochlorothiazide (ZESTORETIC) 20-25 MG tablet [id number] Yes Take 1 tablet by mouth daily. Einar Pheasant, MD Taking Active   magnesium oxide (MAG-OX) 400 (240 Mg) MG tablet [id number] Yes Take 1 tablet (400 mg total) by mouth daily. Einar Pheasant, MD Taking Active   metFORMIN (GLUCOPHAGE) 500 MG tablet [id number] Yes TAKE 1 TABLET BY MOUTH EVERY DAY Einar Pheasant, MD Taking Active   rosuvastatin (CRESTOR) 20 MG tablet [id number] Yes TAKE 1 TABLET BY MOUTH EVERY DAY Einar Pheasant, MD Taking  Active   sertraline (ZOLOFT) 100 MG tablet [id number] Yes Take 1 tablet (100 mg total) by mouth daily.  Patient taking differently: Take 150 mg by mouth daily.   Einar Pheasant, MD Taking Active   vitamin B-12 (CYANOCOBALAMIN) 1000 MCG tablet [id number] Yes Take 5,000 mcg by mouth daily. [provider] Taking Active             Pertinent Labs:   Lab Results  Component Value Date   HGBA1C 6.6 (H) 12/30/2020   Lab Results  Component Value Date   CHOL 148 05/20/2021   HDL 56.10 05/20/2021   LDLCALC 69 05/20/2021   TRIG 118.0 05/20/2021   CHOLHDL 3 05/20/2021   Lab Results  Component Value Date   CREATININE 1.10 05/20/2021   BUN 19 05/20/2021   NA 139 05/20/2021   K 3.5 05/20/2021   CL 102 05/20/2021   CO2 29 05/20/2021    SDOH:  (Social Determinants of Health) assessments and interventions performed:    [place]  Review of patient past medical history, allergies, medications, health status, including review of consultants reports, laboratory and other test data, was performed as part of comprehensive evaluation and provision of chronic care management services.   Care Plan : Medication Management  Updates made by De Hollingshead, RPH-CPP since 09/12/2021 12:00 AM     Problem: Diabetes, Obesity      Long-Range Goal: Disease Progression Prevention   Start Date: 09/05/2021  Recent Progress: On track  Priority: High  Note:   Current Barriers:  Unable to achieve control of weight   Pharmacist Clinical Goal(s):  patient will achieve improvement in weight through collaboration with PharmD and provider.   Interventions: 1:1 collaboration with Einar Pheasant, MD regarding development and update of comprehensive plan of care as evidenced by provider attestation and co-signature Inter-disciplinary care team collaboration (see longitudinal plan of care) Comprehensive medication review performed; medication list updated in electronic medical  record  Overweight/Obesity Complicated by Diabetes:   Last A1c controlled, but unable to achieve goal weight loss through lifestyle modification alone; current treatment: metformin 500 mg daily; Mounjaro 2.5 mg weekly Baseline weight: 176 lbs Current meal patterns: breakfast: coffee; lunch: [employer], no bread, slaw; sometimes chickfila w/ chicken strips, salads; tries to avoid carbs/sugars; dinner: if she skips lunch, she'll eat dinner; snacks: not lately ; drinks: focusing on drinking more water, coffee in the mornings Current exercise: little exercise recently, but plans to increase Received notice from patient that clarification was required on prescription. Called [facility]. They wanted to clarify whether patient was to be on 2.5 mg or 5 mg. They confirmed that copay at mail order would also be $90. Asked that they hold the 5 mg dose until the patient decides whether she would like to continue therapy. Will advise patient to contact insurance to ask what Tier Darcel Bayley is being covered at and if a Tier Exception would be appropriate. Will also encourage to check coverage for Ozempic.   Hypertension:   Controlled; current treatment: lisinopril/HCTZ 20/25 mg daily Recommended to continue current regimen at this time  Hyperlipidemia:   Controlled; current treatment: rosuvastatin 20 mg daily;  Recommended to continue current regimen at this time  Patient Goals/Self-Care Activities patient will:  - target a minimum of 150 minutes of moderate intensity exercise weekly engage in dietary modifications by moderating portion sizes       Plan: Telephone follow up appointment with care management team member scheduled for:  8 weeks  Catie Darnelle Maffucci, PharmD, [place], [place] Clinical Pharmacist [employer] [facility] [employer] [phone]

## 2021-09-13 ENCOUNTER — Telehealth: Payer: Medicare Other | Admitting: Family Medicine

## 2021-09-13 DIAGNOSIS — R6889 Other general symptoms and signs: Secondary | ICD-10-CM | POA: Diagnosis not present

## 2021-09-13 MED ORDER — OSELTAMIVIR PHOSPHATE 75 MG PO CAPS: 75.0000 mg | ORAL_CAPSULE | Freq: Two times a day (BID) | ORAL | 0 refills | Status: AC

## 2021-09-13 NOTE — Telephone Encounter (Signed)
Called patient to offer her a virtual visit. Patient stated that she would like to hold off. She was wanting to get a rapid test because they are supposed to be going out of town. Advised that rapid tests and in-home tests are the same thing. Patient is going to test again with in home test and test her husband as well because he is not feeling well and let me know if they wish to schedule an appt. Also discussed my chart virtual urgent care options as well. Patient advised she would let me know if they need anything further.

## 2021-09-13 NOTE — Patient Instructions (Addendum)
I appreciate the opportunity to provide you with care for your health and wellness.  Take medication as directed.  Please continue to practice social distancing to keep you, your family, and our community safe.  If you must go out, please wear a mask and practice good handwashing.  Have a wonderful day. With Gratitude, Cherly Beach, DNP, AGNP-BC  Influenza or "the flu" is   an infection caused by a respiratory virus. The flu virus is highly contagious and persons who did not receive their yearly flu vaccination may "catch" the flu from close contact.  We have anti-viral medications to treat the viruses that cause this infection. They are not a "cure" and only shorten the course of the infection. These prescriptions are most effective when they are given within the first 2 days of "flu" symptoms. Antiviral medication are indicated if you have a high risk of complications from the flu. You should  also consider an antiviral medication if you are in close contact with someone who is at risk. These medications can help patients avoid complications from the flu  but have side effects that you should know. Possible side effects from Tamiflu or oseltamivir include nausea, vomiting, diarrhea, dizziness, headaches, eye redness, sleep problems or other respiratory symptoms. You should not take Tamiflu if you have an allergy to oseltamivir or any to the ingredients in Tamiflu.   ANYONE WHO HAS FLU SYMPTOMS SHOULD: Stay home. The flu is highly contagious and going out or to work exposes others! Be sure to drink plenty of fluids. Water is fine as well as fruit juices, sodas and electrolyte beverages. You may want to stay away from caffeine or alcohol. If you are nauseated, try taking small sips of liquids. How do you know if you are getting enough fluid? Your urine should be a pale yellow or almost colorless. Get rest. Taking a steamy shower or using a humidifier may help nasal congestion and ease sore  throat pain. Using a saline nasal spray works much the same way. Cough drops, hard candies and sore throat lozenges may ease your cough. Line up a caregiver. Have someone check on you regularly.   GET HELP RIGHT AWAY IF: You cannot keep down liquids or your medications. You become short of breath Your fell like you are going to pass out or loose consciousness. Your symptoms persist after you have completed your treatment plan MAKE SURE YOU  Understand these instructions. Will watch your condition. Will get help right away if you are not doing well or get worse.

## 2021-09-13 NOTE — Progress Notes (Signed)
Virtual Visit Consent   Tonya Greer, you are scheduled for a virtual visit with a [facility] provider today.     Just as with appointments in the office, your consent must be obtained to participate.  Your consent will be active for this visit and any virtual visit you may have with one of our providers in the next 365 days.     If you have a MyChart account, a copy of this consent can be sent to you electronically.  All virtual visits are billed to your insurance company just like a traditional visit in the office.    As this is a virtual visit, video technology does not allow for your provider to perform a traditional examination.  This may limit your provider's ability to fully assess your condition.  If your provider identifies any concerns that need to be evaluated in person or the need to arrange testing (such as labs, EKG, etc.), we will make arrangements to do so.     Although advances in technology are sophisticated, we cannot ensure that it will always work on either your end or our end.  If the connection with a video visit is poor, the visit may have to be switched to a telephone visit.  With either a video or telephone visit, we are not always able to ensure that we have a secure connection.     I need to obtain your verbal consent now.   Are you willing to proceed with your visit today?    Tonya Greer has provided verbal consent on 09/13/2021 for a virtual visit (video or telephone).   Tonya Mayo, NP   Date: 09/13/2021 10:58 AM   Virtual Visit via Video Note   I, Tonya Greer, connected with  Tonya Greer  ([id number], 1956-03-05) on 09/13/21 at 11:00 AM EST by a video-enabled telemedicine application and verified that I am speaking with the correct person using two identifiers.  Location: Patient: Virtual Visit Location Patient: Home Provider: Virtual Visit Location Provider: Home Office   I discussed the limitations of evaluation and management by telemedicine  and the availability of in person appointments. The patient expressed understanding and agreed to proceed.    History of Present Illness: Tonya Greer is a 65 y.o. who identifies as a female who was assigned female at birth, and is being seen today for flu like symptoms.  Fever T Max was 100.5   HPI: Influenza This is a new problem. The current episode started yesterday. The problem occurs constantly. The problem has been gradually worsening. Associated symptoms include arthralgias, chills, congestion, fatigue, a fever, headaches and myalgias. Pertinent negatives include no chest pain, coughing, nausea, sore throat or vomiting. She has tried acetaminophen, NSAIDs and lying down for the symptoms. The treatment provided mild relief.     Problems:  Patient Active Problem List   Diagnosis Date Noted   Anxiety 07/30/2021   Health care maintenance 05/20/2021   Hypertension 03/14/2021   Fasciculations 01/22/2021   Palpitations 01/22/2021   Hypothyroid 07/18/2020   OSA (obstructive sleep apnea) 07/18/2020   SOB (shortness of breath) 07/18/2020   Leg cramps 07/18/2020   Mild depression 06/26/2016   DM type 2 with diabetic dyslipidemia ([place]) 07/05/2015    Allergies:  Allergies  Allergen Reactions   Hydrocodone Itching   Oxycodone Itching   Medications:  Current Outpatient Medications:    ALPHA LIPOIC ACID PO, Take 600 mg by mouth daily., Disp: , Rfl:    anastrozole (ARIMIDEX)  1 MG tablet, Take 1 mg by mouth daily., Disp: , Rfl:    aspirin 81 MG chewable tablet, Chew 1 tablet by mouth daily., Disp: , Rfl:    CALCIUM CARB-CHOLECALCIFEROL PO, Take by mouth., Disp: , Rfl:    levothyroxine (SYNTHROID) 100 MCG tablet, TAKE 1 TABLET BY MOUTH EVERY DAY, Disp: 90 tablet, Rfl: 1   lisinopril-hydrochlorothiazide (ZESTORETIC) 20-25 MG tablet, Take 1 tablet by mouth daily., Disp: 90 tablet, Rfl: 1   magnesium oxide (MAG-OX) 400 (240 Mg) MG tablet, Take 1 tablet (400 mg total) by mouth daily., Disp:  30 tablet, Rfl: 2   metFORMIN (GLUCOPHAGE) 500 MG tablet, TAKE 1 TABLET BY MOUTH EVERY DAY, Disp: 90 tablet, Rfl: 1   rosuvastatin (CRESTOR) 20 MG tablet, TAKE 1 TABLET BY MOUTH EVERY DAY, Disp: 90 tablet, Rfl: 1   sertraline (ZOLOFT) 100 MG tablet, Take 1 tablet (100 mg total) by mouth daily. (Patient taking differently: Take 150 mg by mouth daily.), Disp: 90 tablet, Rfl: 1   tirzepatide (MOUNJARO) 2.5 MG/0.5ML Pen, Inject 2.5 mg into the skin once a week., Disp: 2 mL, Rfl: 2   tirzepatide (MOUNJARO) 5 MG/0.5ML Pen, Inject 5 mg into the skin once a week., Disp: 2 mL, Rfl: 2   vitamin B-12 (CYANOCOBALAMIN) 1000 MCG tablet, Take 5,000 mcg by mouth daily., Disp: , Rfl:   Observations/Objective: Patient is well-developed, well-nourished in no acute distress.  Resting comfortably  at home.  Head is normocephalic, atraumatic.  No labored breathing.  Speech is clear and coherent with logical content.  Patient is alert and oriented at baseline.   Assessment and Plan: 1. Flu-like symptoms S&S are consistent with flu like illness and antiviral within first 2 days since start was Sunday night.   OTC measures and on AVS  - oseltamivir (TAMIFLU) 75 MG capsule; Take 1 capsule (75 mg total) by mouth 2 (two) times daily for 5 days.  Dispense: 10 capsule; Refill: 0  Reviewed side effects, risks and benefits of medication.    Patient acknowledged agreement and understanding of the plan.   I discussed the assessment and treatment plan with the patient. The patient was provided an opportunity to ask questions and all were answered. The patient agreed with the plan and demonstrated an understanding of the instructions.   The patient was advised to call back or seek an in-person evaluation if the symptoms worsen or if the condition fails to improve as anticipated.   The above assessment and management plan was discussed with the patient. The patient verbalized understanding of and has agreed to the  management plan. Patient is aware to call the clinic if symptoms persist or worsen. Patient is aware when to return to the clinic for a follow-up visit. Patient educated on when it is appropriate to go to the emergency department.    Follow Up Instructions: I discussed the assessment and treatment plan with the patient. The patient was provided an opportunity to ask questions and all were answered. The patient agreed with the plan and demonstrated an understanding of the instructions.  A copy of instructions were sent to the patient via MyChart unless otherwise noted below.     The patient was advised to call back or seek an in-person evaluation if the symptoms worsen or if the condition fails to improve as anticipated.  Time:  I spent 10 minutes with the patient via telehealth technology discussing the above problems/concerns.    Tonya Mayo, NP

## 2021-09-14 DIAGNOSIS — E785 Hyperlipidemia, unspecified: Secondary | ICD-10-CM

## 2021-09-14 DIAGNOSIS — E669 Obesity, unspecified: Secondary | ICD-10-CM | POA: Diagnosis not present

## 2021-09-14 DIAGNOSIS — I10 Essential (primary) hypertension: Secondary | ICD-10-CM | POA: Diagnosis not present

## 2021-09-14 DIAGNOSIS — E1169 Type 2 diabetes mellitus with other specified complication: Secondary | ICD-10-CM | POA: Diagnosis not present

## 2021-09-14 DIAGNOSIS — Z7984 Long term (current) use of oral hypoglycemic drugs: Secondary | ICD-10-CM

## 2021-09-19 DIAGNOSIS — H5213 Myopia, bilateral: Secondary | ICD-10-CM | POA: Diagnosis not present

## 2021-09-20 ENCOUNTER — Other Ambulatory Visit: Payer: Self-pay | Admitting: Internal Medicine

## 2021-09-22 ENCOUNTER — Ambulatory Visit (INDEPENDENT_AMBULATORY_CARE_PROVIDER_SITE_OTHER): Payer: Medicare Other | Admitting: Internal Medicine

## 2021-09-22 ENCOUNTER — Encounter: Payer: Self-pay | Admitting: Internal Medicine

## 2021-09-22 ENCOUNTER — Ambulatory Visit (INDEPENDENT_AMBULATORY_CARE_PROVIDER_SITE_OTHER): Payer: Medicare Other

## 2021-09-22 ENCOUNTER — Other Ambulatory Visit: Payer: Self-pay

## 2021-09-22 VITALS — BP 110/70 | HR 71 | Temp 98.6°F | Ht 62.0 in | Wt 169.2 lb

## 2021-09-22 DIAGNOSIS — E1169 Type 2 diabetes mellitus with other specified complication: Secondary | ICD-10-CM | POA: Diagnosis not present

## 2021-09-22 DIAGNOSIS — R053 Chronic cough: Secondary | ICD-10-CM | POA: Diagnosis not present

## 2021-09-22 DIAGNOSIS — I10 Essential (primary) hypertension: Secondary | ICD-10-CM | POA: Diagnosis not present

## 2021-09-22 DIAGNOSIS — F419 Anxiety disorder, unspecified: Secondary | ICD-10-CM

## 2021-09-22 DIAGNOSIS — E785 Hyperlipidemia, unspecified: Secondary | ICD-10-CM

## 2021-09-22 DIAGNOSIS — F32A Depression, unspecified: Secondary | ICD-10-CM

## 2021-09-22 DIAGNOSIS — E039 Hypothyroidism, unspecified: Secondary | ICD-10-CM | POA: Diagnosis not present

## 2021-09-22 DIAGNOSIS — G4733 Obstructive sleep apnea (adult) (pediatric): Secondary | ICD-10-CM

## 2021-09-22 DIAGNOSIS — Z6832 Body mass index (BMI) 32.0-32.9, adult: Secondary | ICD-10-CM | POA: Diagnosis not present

## 2021-09-22 DIAGNOSIS — R059 Cough, unspecified: Secondary | ICD-10-CM | POA: Diagnosis not present

## 2021-09-22 LAB — LIPID PANEL
Cholesterol: 136 mg/dL (ref 0–200)
HDL: 47 mg/dL (ref >39.00)
LDL Cholesterol: 72 mg/dL (ref 0–99)
NonHDL: 89.03
Total CHOL/HDL Ratio: 3
Triglycerides: 87 mg/dL (ref 0.0–149.0)
VLDL: 17.4 mg/dL (ref 0.0–40.0)

## 2021-09-22 LAB — HEPATIC FUNCTION PANEL
ALT: 38 U/L — ABNORMAL HIGH (ref 0–35)
AST: 34 U/L (ref 0–37)
Albumin: 4.5 g/dL (ref 3.5–5.2)
Alkaline Phosphatase: 46 U/L (ref 39–117)
Bilirubin, Direct: 0.1 mg/dL (ref 0.0–0.3)
Total Bilirubin: 0.5 mg/dL (ref 0.2–1.2)
Total Protein: 7.2 g/dL (ref 6.0–8.3)

## 2021-09-22 LAB — BASIC METABOLIC PANEL
BUN: 22 mg/dL (ref 6–23)
CO2: 28 mEq/L (ref 19–32)
Calcium: 10 mg/dL (ref 8.4–10.5)
Chloride: 102 mEq/L (ref 96–112)
Creatinine, Ser: 1.14 mg/dL (ref 0.40–1.20)
GFR: 50.16 mL/min — ABNORMAL LOW (ref >60.00)
Glucose, Bld: 98 mg/dL (ref 70–99)
Potassium: 4.1 mEq/L (ref 3.5–5.1)
Sodium: 138 mEq/L (ref 135–145)

## 2021-09-22 LAB — HEMOGLOBIN A1C: Hgb A1c MFr Bld: 6.3 % (ref 4.6–6.5)

## 2021-09-22 LAB — TSH: TSH: 2.26 u[IU]/mL (ref 0.35–5.50)

## 2021-09-22 NOTE — Progress Notes (Signed)
Patient ID: Tonya Greer, female   DOB: [birth year], 65 y.o.   MRN: [id number]   Subjective:    Patient ID: Tonya Greer, female    DOB: 01/29/1956, 65 y.o.   MRN: [id number]  This visit occurred during the SARS-CoV-2 public health emergency.  Safety protocols were in place, including screening questions prior to the visit, additional usage of staff PPE, and extensive cleaning of exam room while observing appropriate contact time as indicated for disinfecting solutions.   Patient here for a scheduled follow up.   Chief Complaint  Patient presents with   Follow-up    4 mo   .   HPI Here to follow up regarding increased stress, hypertension, diabetes and breast cancer.  Recently treated for flu.  11/28 - symptoms - fever, myalgias.  Doing better now.  Still with runny nose and increased drainage.  No chest pain or sob.  She is on zoloft to help with increased stress and anxiety - related mostly to her own health issues.  Recently diagnosed with breast cancer and has done well. Work up for fasciculations - diagnosed MGUS.  Increased stress related to this diagnosis and planned f/u lab.  Has f/u planned 11/2021.  Increased anxiety related.  Has previously done relatively well on zoloft.  Recently increased dose to 168m q day - due to increased anxiety.  Did well on this dose for a while, but now reports not sure is controlling symptoms.  Was questioning changing medication, adding medication or adjusting dose.  Saw counselor - through oncology.  Discussed psych referral.  Eating.  No abdominal pain.  Bowels moving.  On mounjaro and tolerating.     Past Medical History:  Diagnosis Date   Depression    Diabetes mellitus without complication (HCC)    History of carpal tunnel syndrome    Hyperlipidemia    Hypertension    Hypothyroidism    OSA (obstructive sleep apnea)    Past Surgical History:  Procedure Laterality Date   ABDOMINAL HYSTERECTOMY     ovaries left in place   brreast biopsy      FOOT SURGERY     TONSILLECTOMY AND ADENOIDECTOMY     Family History  Problem Relation Age of Onset   Heart disease Mother    Social History   Socioeconomic History   Marital status: Married    Spouse name: Not on file   Number of children: 2   Years of education: Not on file   Highest education level: Not on file  Occupational History   Not on file  Tobacco Use   Smoking status: Never   Smokeless tobacco: Never  Substance and Sexual Activity   Alcohol use: Not on file   Drug use: Never   Sexual activity: Not on file  Other Topics Concern   Not on file  Social History Narrative   Not on file   Social Determinants of Health   Financial Resource Strain: Low Risk    Difficulty of Paying Living Expenses: Not hard at all  Food Insecurity: No Food Insecurity   Worried About RCharity fundraiserin the Last Year: Never true   Ran Out of Food in the Last Year: Never true  Transportation Needs: No Transportation Needs   Lack of Transportation (Medical): No   Lack of Transportation (Non-Medical): No  Physical Activity: Not on file  Stress: No Stress Concern Present   Feeling of Stress : Not at all  Social Connections: Unknown   Frequency  of Communication with Friends and Family: Not on file   Frequency of Social Gatherings with Friends and Family: Not on file   Attends Religious Services: Not on file   Active Member of Clubs or Organizations: Not on file   Attends Archivist Meetings: Not on file   Marital Status: Married     Review of Systems  Constitutional:  Negative for appetite change and unexpected weight change.  HENT:  Negative for congestion and sinus pressure.   Respiratory:  Negative for cough, chest tightness and shortness of breath.   Cardiovascular:  Negative for chest pain, palpitations and leg swelling.  Gastrointestinal:  Negative for abdominal pain, diarrhea, nausea and vomiting.  Genitourinary:  Negative for difficulty urinating and dysuria.   Musculoskeletal:  Negative for joint swelling and myalgias.  Skin:  Negative for color change and rash.  Neurological:  Negative for dizziness, light-headedness and headaches.  Psychiatric/Behavioral:  Negative for dysphoric mood.        Increased anxiety as outlined.        Objective:     BP 110/70   Pulse 71   Temp 98.6 F (37 C) (Oral)   Ht 5' 2"  (1.575 m)   Wt 169 lb 3.2 oz (76.7 kg)   SpO2 97%   BMI 30.95 kg/m  Wt Readings from Last 3 Encounters:  09/22/21 169 lb 3.2 oz (76.7 kg)  07/27/21 176 lb (79.8 kg)  05/20/21 192 lb 12.8 oz (87.5 kg)    Physical Exam Vitals reviewed.  Constitutional:      General: She is not in acute distress.    Appearance: Normal appearance.  HENT:     Head: Normocephalic and atraumatic.     Right Ear: External ear normal.     Left Ear: External ear normal.  Eyes:     General: No scleral icterus.       Right eye: No discharge.        Left eye: No discharge.     Conjunctiva/sclera: Conjunctivae normal.  Neck:     Thyroid: No thyromegaly.  Cardiovascular:     Rate and Rhythm: Normal rate and regular rhythm.  Pulmonary:     Effort: No respiratory distress.     Breath sounds: Normal breath sounds. No wheezing.  Abdominal:     General: Bowel sounds are normal.     Palpations: Abdomen is soft.     Tenderness: There is no abdominal tenderness.  Musculoskeletal:        General: No swelling or tenderness.     Cervical back: Neck supple. No tenderness.  Lymphadenopathy:     Cervical: No cervical adenopathy.  Skin:    Findings: No erythema or rash.  Neurological:     Mental Status: She is alert.  Psychiatric:        Mood and Affect: Mood normal.        Behavior: Behavior normal.     Outpatient Encounter Medications as of 09/22/2021  Medication Sig   ALPHA LIPOIC ACID PO Take 600 mg by mouth daily.   anastrozole (ARIMIDEX) 1 MG tablet Take 1 mg by mouth daily.   aspirin 81 MG chewable tablet Chew 1 tablet by mouth daily.    CALCIUM CARB-CHOLECALCIFEROL PO Take by mouth.   levothyroxine (SYNTHROID) 100 MCG tablet TAKE 1 TABLET BY MOUTH EVERY DAY   lisinopril-hydrochlorothiazide (ZESTORETIC) 20-25 MG tablet Take 1 tablet by mouth daily.   magnesium oxide (MAG-OX) 400 (240 Mg) MG tablet TAKE 1 TABLET BY  MOUTH EVERY DAY   metFORMIN (GLUCOPHAGE) 500 MG tablet TAKE 1 TABLET BY MOUTH EVERY DAY   rosuvastatin (CRESTOR) 20 MG tablet TAKE 1 TABLET BY MOUTH EVERY DAY   sertraline (ZOLOFT) 100 MG tablet Take 1 tablet (100 mg total) by mouth daily. (Patient taking differently: Take 150 mg by mouth daily.)   tirzepatide (MOUNJARO) 2.5 MG/0.5ML Pen Inject 2.5 mg into the skin once a week.   tirzepatide [facility]) 5 MG/0.5ML Pen Inject 5 mg into the skin once a week.   vitamin B-12 (CYANOCOBALAMIN) 1000 MCG tablet Take 5,000 mcg by mouth daily.   No facility-administered encounter medications on file as of 09/22/2021.     Lab Results  Component Value Date   WBC 5.2 05/20/2021   HGB 12.3 05/20/2021   HCT 37.4 05/20/2021   PLT 160.0 05/20/2021   GLUCOSE 98 09/22/2021   CHOL 136 09/22/2021   TRIG 87.0 09/22/2021   HDL 47.00 09/22/2021   LDLCALC 72 09/22/2021   ALT 38 (H) 09/22/2021   AST 34 09/22/2021   NA 138 09/22/2021   K 4.1 09/22/2021   CL 102 09/22/2021   CREATININE 1.14 09/22/2021   BUN 22 09/22/2021   CO2 28 09/22/2021   TSH 2.26 09/22/2021   HGBA1C 6.3 09/22/2021       Assessment & Plan:   Problem List Items Addressed This Visit     Anxiety    On zoloft as outlined.  Currently on 177m q day.  Feels needs something more to help level things out.  Given persistent issues, will refer to psychiatry for further evaluation and treatment.       Relevant Orders   Ambulatory referral to Psychiatry   DM type 2 with diabetic dyslipidemia ([facility] - Primary    Low carb diet and exercise.  Continue metformin.  Follow met b and a1c.  Lab Results  Component Value Date   HGBA1C 6.3 09/22/2021       Relevant  Orders   HgB A1c (Completed)   Hypertension    Blood pressure as outlined.  Continue lisinopril/hctz.  Follow pressures.  Follow metabolic panel.       Relevant Orders   Basic Metabolic Panel (BMET) (Completed)   Lipid panel (Completed)   Hepatic function panel (Completed)   Hypothyroid    On thyroid replacement.  Follow tsh.       Relevant Orders   TSH (Completed)   Mild depression    On zoloft.  Increased stress and anxiety with her current health issues.  Discussed.  Seeing counselor through oncology.  Discussed psych referral - to help with medication adjustment and treatment/counseling.       Relevant Orders   Ambulatory referral to Psychiatry   OSA (obstructive sleep apnea)    Continue cpap.       Persistent cough    Recently treated for flu.  Persistent cough.  Recently treated.  Check cxr to confirm lungs clear.       Relevant Orders   DG Chest 2 View (Completed)   Other Visit Diagnoses     BMI 32.0-32.9,adult       Relevant Orders   Lipid panel (Completed)   Hepatic function panel (Completed)        CEinar Pheasant MD

## 2021-09-25 ENCOUNTER — Encounter: Payer: Self-pay | Admitting: Internal Medicine

## 2021-09-25 NOTE — Assessment & Plan Note (Signed)
Recently treated for flu.  Persistent cough.  Recently treated.  Check cxr to confirm lungs clear.

## 2021-09-25 NOTE — Assessment & Plan Note (Signed)
Low carb diet and exercise.  Continue metformin.  Follow met b and a1c.  Lab Results  Component Value Date   HGBA1C 6.3 09/22/2021

## 2021-09-25 NOTE — Assessment & Plan Note (Signed)
On zoloft as outlined.  Currently on 150mg  q day.  Feels needs something more to help level things out.  Given persistent issues, will refer to psychiatry for further evaluation and treatment.

## 2021-09-25 NOTE — Assessment & Plan Note (Signed)
On thyroid replacement.  Follow tsh.  

## 2021-09-25 NOTE — Assessment & Plan Note (Signed)
Continue cpap.  

## 2021-09-25 NOTE — Assessment & Plan Note (Signed)
Blood pressure as outlined.  Continue lisinopril/hctz.  Follow pressures.  Follow metabolic panel.

## 2021-09-25 NOTE — Assessment & Plan Note (Signed)
On zoloft.  Increased stress and anxiety with her current health issues.  Discussed.  Seeing counselor through oncology.  Discussed psych referral - to help with medication adjustment and treatment/counseling.

## 2021-09-27 ENCOUNTER — Other Ambulatory Visit: Payer: Self-pay | Admitting: *Deleted

## 2021-09-27 DIAGNOSIS — R7401 Elevation of levels of liver transaminase levels: Secondary | ICD-10-CM

## 2021-10-04 DIAGNOSIS — R0683 Snoring: Secondary | ICD-10-CM | POA: Diagnosis not present

## 2021-10-04 DIAGNOSIS — F32A Depression, unspecified: Secondary | ICD-10-CM | POA: Diagnosis not present

## 2021-10-04 DIAGNOSIS — I10 Essential (primary) hypertension: Secondary | ICD-10-CM | POA: Diagnosis not present

## 2021-10-04 DIAGNOSIS — G4733 Obstructive sleep apnea (adult) (pediatric): Secondary | ICD-10-CM | POA: Diagnosis not present

## 2021-10-18 ENCOUNTER — Other Ambulatory Visit: Payer: Medicare Other

## 2021-10-19 ENCOUNTER — Telehealth: Payer: Self-pay | Admitting: Internal Medicine

## 2021-10-19 DIAGNOSIS — F32A Depression, unspecified: Secondary | ICD-10-CM

## 2021-10-19 DIAGNOSIS — F419 Anxiety disorder, unspecified: Secondary | ICD-10-CM

## 2021-10-19 NOTE — Telephone Encounter (Signed)
I had placed order for referral to Dr Nicolasa Ducking.  Received faxed notification - does not take insurance.  Do I need to reorder or can we resend psych referral to [facility] psych?  Thank you for your help.

## 2021-10-19 NOTE — Addendum Note (Signed)
Addended by: Alisa Graff on: 10/19/2021 08:21 PM   Modules accepted: Orders

## 2021-10-19 NOTE — Telephone Encounter (Signed)
Order placed for psych referral.   

## 2021-10-20 ENCOUNTER — Ambulatory Visit (INDEPENDENT_AMBULATORY_CARE_PROVIDER_SITE_OTHER): Payer: Medicare Other | Admitting: Pharmacist

## 2021-10-20 ENCOUNTER — Encounter: Payer: Self-pay | Admitting: Internal Medicine

## 2021-10-20 VITALS — Wt 165.0 lb

## 2021-10-20 DIAGNOSIS — E785 Hyperlipidemia, unspecified: Secondary | ICD-10-CM

## 2021-10-20 DIAGNOSIS — I10 Essential (primary) hypertension: Secondary | ICD-10-CM

## 2021-10-20 DIAGNOSIS — E1169 Type 2 diabetes mellitus with other specified complication: Secondary | ICD-10-CM

## 2021-10-20 MED ORDER — TIRZEPATIDE 7.5 MG/0.5ML ~~LOC~~ SOAJ: 7.5000 mg | SUBCUTANEOUS | 2 refills | Status: DC

## 2021-10-20 NOTE — Chronic Care Management (AMB) (Signed)
Chronic Care Management CCM Pharmacy Note  10/20/2021 Name:  Tonya Greer MRN:  [id number] DOB:  10/14/1956  Summary: - Tolerating regimen well  Recommendations/Changes made from today's visit: - Increase Mounjaro to 7.5 mg weekly  Subjective: Tonya Greer is an 66 y.o. year old female who is a primary patient of Tonya Pheasant, MD.  The CCM team was consulted for assistance with disease management and care coordination needs.    Engaged with patient by telephone for follow up visit for pharmacy case management and/or care coordination services.   Objective:  Medications Reviewed Today     Reviewed by De Hollingshead, RPH-CPP (Pharmacist) on 10/20/21 at 0910  Med List Status: <None>   Medication Order Taking? Sig Documenting Provider Last Dose Status Informant  ALPHA LIPOIC ACID PO [id number]  Take 600 mg by mouth daily. [provider]  Active   anastrozole (ARIMIDEX) 1 MG tablet [id number]  Take 1 mg by mouth daily. [provider]  Active   aspirin 81 MG chewable tablet [id number]  Chew 1 tablet by mouth daily. [provider]  Active   CALCIUM CARB-CHOLECALCIFEROL PO [id number]  Take by mouth. [provider]  Active   levothyroxine (SYNTHROID) 100 MCG tablet [id number]  TAKE 1 TABLET BY MOUTH EVERY DAY Tonya Pheasant, MD  Active   lisinopril-hydrochlorothiazide (ZESTORETIC) 20-25 MG tablet [id number]  Take 1 tablet by mouth daily. Tonya Pheasant, MD  Active   magnesium oxide (MAG-OX) 400 (240 Mg) MG tablet [id number]  TAKE 1 TABLET BY MOUTH EVERY DAY Tonya Pheasant, MD  Active   metFORMIN (GLUCOPHAGE) 500 MG tablet [id number]  TAKE 1 TABLET BY MOUTH EVERY DAY Tonya Pheasant, MD  Active   rosuvastatin (CRESTOR) 20 MG tablet [id number]  TAKE 1 TABLET BY MOUTH EVERY DAY Tonya Pheasant, MD  Active   sertraline (ZOLOFT) 100 MG tablet [id number]  Take 1 tablet (100 mg total) by mouth daily.  Patient taking differently: Take 150 mg by mouth daily.    Tonya Pheasant, MD  Active   tirzepatide [facility]) 5 MG/0.5ML Pen [id number] Yes Inject 5 mg into the skin once a week. Tonya Pheasant, MD Taking Active   vitamin B-12 (CYANOCOBALAMIN) 1000 MCG tablet [id number]  Take 5,000 mcg by mouth daily. [provider]  Active             Pertinent Labs:   Lab Results  Component Value Date   HGBA1C 6.3 09/22/2021   Lab Results  Component Value Date   CHOL 136 09/22/2021   HDL 47.00 09/22/2021   LDLCALC 72 09/22/2021   TRIG 87.0 09/22/2021   CHOLHDL 3 09/22/2021   Lab Results  Component Value Date   CREATININE 1.14 09/22/2021   BUN 22 09/22/2021   NA 138 09/22/2021   K 4.1 09/22/2021   CL 102 09/22/2021   CO2 28 09/22/2021    SDOH:  (Social Determinants of Health) assessments and interventions performed:  SDOH Interventions    Flowsheet Row Most Recent Value  SDOH Interventions   Financial Strain Interventions Intervention Not Indicated       CCM Care Plan  Review of patient past medical history, allergies, medications, health status, including review of consultants reports, laboratory and other test data, was performed as part of comprehensive evaluation and provision of chronic care management services.   Care Plan : Medication Management  Updates made by De Hollingshead, RPH-CPP since 10/20/2021 12:00 AM     Problem: Diabetes, Obesity  Long-Range Goal: Disease Progression Prevention   Start Date: 09/05/2021  Recent Progress: On track  Priority: High  Note:   Current Barriers:  Unable to achieve control of weight   Pharmacist Clinical Goal(s):  patient will achieve improvement in weight through collaboration with PharmD and provider.   Interventions: 1:1 collaboration with Tonya Pheasant, MD regarding development and update of comprehensive plan of care as evidenced by provider attestation and co-signature Inter-disciplinary care team collaboration (see longitudinal plan of  care) Comprehensive medication review performed; medication list updated in electronic medical record  Health Maintenance   Yearly diabetic eye exam: up to date Yearly diabetic foot exam: up to date Urine microalbumin: up to date Yearly influenza vaccination: up to date Td/Tdap vaccination: up to date Pneumonia vaccination: up to date COVID vaccinations: up to date Shingrix vaccinations: up to date Colonoscopy: due- recommended to discuss w/ PCP at next appointment Bone density scan: due - recommend to discuss w/ PCP at next appointment Mammogram: up to date   Overweight/Obesity Complicated by Diabetes:   Last A1c controlled, but unable to achieve goal weight loss through lifestyle modification alone; current treatment: metformin 500 mg daily; Mounjaro 5 mg weekly Baseline weight: 176 lbs; most recent weight 165 lbs; total weight lost 11 lbs (6% lost from baseline) Current meal patterns: breakfast: coffee; focusing on drinking more water, coffee in the mornings w/ stevia  Current exercise: little exercise recently, but plans to increase.  Requests to increase dose. Plateaued on weight loss. Increase Mounjaro to 7.5 mg weekly. She notes she has a new insurance, plans to upload information in case new authorization is needed.  Extensive discussion on lifestyle modifications. Discussed increasing exercise, starting with attainable goals.   Hypertension:   Controlled; current treatment: lisinopril/HCTZ 20/25 mg daily Previously recommended to continue current regimen at this time  Hyperlipidemia:   Controlled; current treatment: rosuvastatin 20 mg daily;  Previously recommended to continue current regimen at this time  Patient Goals/Self-Care Activities patient will:  - target a minimum of 150 minutes of moderate intensity exercise weekly engage in dietary modifications by moderating portion sizes       Plan: Telephone follow up appointment with care management team member  scheduled for:  12 weeks  Catie Darnelle Maffucci, PharmD, [place], [place] Clinical Pharmacist [employer] at [employer] [phone]

## 2021-10-20 NOTE — Patient Instructions (Signed)
Increase Mounjaro to 7.5 mg weekly. Let me know if your pharmacy does not have it in.   Visit Information  Following are the goals we discussed today:  Patient Goals/Self-Care Activities patient will:  - target a minimum of 150 minutes of moderate intensity exercise weekly engage in dietary modifications by moderating portion sizes          Plan: Telephone follow up appointment with care management team member scheduled for:  12 weeks   Catie Darnelle Maffucci, PharmD, [place], CPP Clinical Pharmacist [place] at [facility] [phone]   Please call the care guide team at [phone] if you need to cancel or reschedule your appointment.   Patient verbalizes understanding of instructions provided today and agrees to view in [place].

## 2021-10-27 DIAGNOSIS — E119 Type 2 diabetes mellitus without complications: Secondary | ICD-10-CM | POA: Diagnosis not present

## 2021-11-09 ENCOUNTER — Encounter: Payer: Self-pay | Admitting: Internal Medicine

## 2021-11-10 ENCOUNTER — Other Ambulatory Visit: Payer: Self-pay

## 2021-11-10 MED ORDER — SERTRALINE HCL 50 MG PO TABS: ORAL_TABLET | ORAL | 0 refills | Status: DC

## 2021-11-10 MED ORDER — SERTRALINE HCL 100 MG PO TABS: ORAL_TABLET | ORAL | 0 refills | Status: DC

## 2021-11-15 DIAGNOSIS — I10 Essential (primary) hypertension: Secondary | ICD-10-CM

## 2021-11-15 DIAGNOSIS — E785 Hyperlipidemia, unspecified: Secondary | ICD-10-CM | POA: Diagnosis not present

## 2021-11-15 DIAGNOSIS — E1169 Type 2 diabetes mellitus with other specified complication: Secondary | ICD-10-CM | POA: Diagnosis not present

## 2021-11-16 ENCOUNTER — Encounter: Payer: Self-pay | Admitting: Internal Medicine

## 2021-11-16 NOTE — Telephone Encounter (Signed)
It looks like you just increased her to 7.5 in January.

## 2021-11-22 ENCOUNTER — Other Ambulatory Visit: Payer: Self-pay

## 2021-11-22 ENCOUNTER — Other Ambulatory Visit (INDEPENDENT_AMBULATORY_CARE_PROVIDER_SITE_OTHER): Payer: Medicare Other

## 2021-11-22 DIAGNOSIS — R7401 Elevation of levels of liver transaminase levels: Secondary | ICD-10-CM | POA: Diagnosis not present

## 2021-11-22 LAB — HEPATIC FUNCTION PANEL
ALT: 33 U/L (ref 0–35)
AST: 27 U/L (ref 0–37)
Albumin: 4.5 g/dL (ref 3.5–5.2)
Alkaline Phosphatase: 44 U/L (ref 39–117)
Bilirubin, Direct: 0.1 mg/dL (ref 0.0–0.3)
Total Bilirubin: 0.5 mg/dL (ref 0.2–1.2)
Total Protein: 6.8 g/dL (ref 6.0–8.3)

## 2021-11-24 ENCOUNTER — Ambulatory Visit (INDEPENDENT_AMBULATORY_CARE_PROVIDER_SITE_OTHER): Payer: Medicare Other | Admitting: Internal Medicine

## 2021-11-24 ENCOUNTER — Other Ambulatory Visit: Payer: Self-pay

## 2021-11-24 VITALS — BP 112/68 | HR 73 | Temp 97.8°F | Resp 16 | Ht 62.0 in | Wt 157.6 lb

## 2021-11-24 DIAGNOSIS — E1169 Type 2 diabetes mellitus with other specified complication: Secondary | ICD-10-CM | POA: Diagnosis not present

## 2021-11-24 DIAGNOSIS — I10 Essential (primary) hypertension: Secondary | ICD-10-CM

## 2021-11-24 DIAGNOSIS — Z13220 Encounter for screening for lipoid disorders: Secondary | ICD-10-CM

## 2021-11-24 DIAGNOSIS — E785 Hyperlipidemia, unspecified: Secondary | ICD-10-CM

## 2021-11-24 DIAGNOSIS — E039 Hypothyroidism, unspecified: Secondary | ICD-10-CM

## 2021-11-24 DIAGNOSIS — F320 Major depressive disorder, single episode, mild: Secondary | ICD-10-CM

## 2021-11-24 DIAGNOSIS — G4733 Obstructive sleep apnea (adult) (pediatric): Secondary | ICD-10-CM | POA: Diagnosis not present

## 2021-11-24 DIAGNOSIS — F419 Anxiety disorder, unspecified: Secondary | ICD-10-CM

## 2021-11-24 NOTE — Progress Notes (Signed)
Patient ID: Tonya Greer, female   DOB: 1956-07-17, 66 y.o.   MRN: [id number]   Subjective:    Patient ID: Tonya Greer, female    DOB: 09-25-1956, 66 y.o.   MRN: [id number]  This visit occurred during the SARS-CoV-2 public health emergency.  Safety protocols were in place, including screening questions prior to the visit, additional usage of staff PPE, and extensive cleaning of exam room while observing appropriate contact time as indicated for disinfecting solutions.   Patient here for a scheduled follow up.   Chief Complaint  Patient presents with   Hyperlipidemia   Diabetes   Hypertension   Hypothyroidism   .   HPI Doing better. Feels better. Has lost weight.  On Mounjaro 7.5.  tolerating pretty well.  May notice occasional nausea, but does not appear to be significant at this time.  Some constipation.  Discussed miralax.  She wants to continue on this dose for now.  No chest pain or sob reported.  Walking - three days per week.  Feels better.  No vomiting.  Has f/u with oncology - in March. (Appt moved).     Past Medical History:  Diagnosis Date   Depression    Diabetes mellitus without complication (HCC)    History of carpal tunnel syndrome    Hyperlipidemia    Hypertension    Hypothyroidism    OSA (obstructive sleep apnea)    Past Surgical History:  Procedure Laterality Date   ABDOMINAL HYSTERECTOMY     ovaries left in place   brreast biopsy     FOOT SURGERY     TONSILLECTOMY AND ADENOIDECTOMY     Family History  Problem Relation Age of Onset   Heart disease Mother    Social History   Socioeconomic History   Marital status: Married    Spouse name: Not on file   Number of children: 2   Years of education: Not on file   Highest education level: Not on file  Occupational History   Not on file  Tobacco Use   Smoking status: Never   Smokeless tobacco: Never  Substance and Sexual Activity   Alcohol use: Not on file   Drug use: Never   Sexual activity: Not  on file  Other Topics Concern   Not on file  Social History Narrative   Not on file   Social Determinants of Health   Financial Resource Strain: Low Risk    Difficulty of Paying Living Expenses: Not hard at all  Food Insecurity: No Food Insecurity   Worried About Charity fundraiser in the Last Year: Never true   Ran Out of Food in the Last Year: Never true  Transportation Needs: No Transportation Needs   Lack of Transportation (Medical): No   Lack of Transportation (Non-Medical): No  Physical Activity: Not on file  Stress: No Stress Concern Present   Feeling of Stress : Not at all  Social Connections: Unknown   Frequency of Communication with Friends and Family: Not on file   Frequency of Social Gatherings with Friends and Family: Not on file   Attends Religious Services: Not on file   Active Member of Clubs or Organizations: Not on file   Attends Archivist Meetings: Not on file   Marital Status: Married     Review of Systems  Constitutional:  Negative for appetite change and unexpected weight change.  HENT:  Negative for congestion and sinus pressure.   Respiratory:  Negative for cough, chest  tightness and shortness of breath.   Cardiovascular:  Negative for chest pain, palpitations and leg swelling.  Gastrointestinal:  Negative for abdominal pain, diarrhea and vomiting.       Minimal nausea.   Genitourinary:  Negative for difficulty urinating and dysuria.  Musculoskeletal:  Negative for joint swelling and myalgias.  Skin:  Negative for color change and rash.  Neurological:  Negative for dizziness, light-headedness and headaches.  Psychiatric/Behavioral:  Negative for agitation and dysphoric mood.        Increased stress. Overall appears to be doing better.       Objective:     BP 112/68    Pulse 73    Temp 97.8 F (36.6 C)    Resp 16    Ht 5' 2"  (1.575 m)    Wt 157 lb 9.6 oz (71.5 kg)    SpO2 97%    BMI 28.83 kg/m  Wt Readings from Last 3 Encounters:   11/24/21 157 lb 9.6 oz (71.5 kg)  10/20/21 165 lb (74.8 kg)  09/22/21 169 lb 3.2 oz (76.7 kg)    Physical Exam Vitals reviewed.  Constitutional:      General: She is not in acute distress.    Appearance: Normal appearance.  HENT:     Head: Normocephalic and atraumatic.     Right Ear: External ear normal.     Left Ear: External ear normal.  Eyes:     General: No scleral icterus.       Right eye: No discharge.        Left eye: No discharge.     Conjunctiva/sclera: Conjunctivae normal.  Neck:     Thyroid: No thyromegaly.  Cardiovascular:     Rate and Rhythm: Normal rate and regular rhythm.  Pulmonary:     Effort: No respiratory distress.     Breath sounds: Normal breath sounds. No wheezing.  Abdominal:     General: Bowel sounds are normal.     Palpations: Abdomen is soft.     Tenderness: There is no abdominal tenderness.  Musculoskeletal:        General: No swelling or tenderness.     Cervical back: Neck supple. No tenderness.  Lymphadenopathy:     Cervical: No cervical adenopathy.  Skin:    Findings: No erythema or rash.  Neurological:     Mental Status: She is alert.  Psychiatric:        Mood and Affect: Mood normal.        Behavior: Behavior normal.     Outpatient Encounter Medications as of 11/24/2021  Medication Sig   sertraline (ZOLOFT) 50 MG tablet Take one tablet 50 mg daily along with the 100 mg tablet to equal 150 mg total.   ALPHA LIPOIC ACID PO Take 600 mg by mouth daily.   anastrozole (ARIMIDEX) 1 MG tablet Take 1 mg by mouth daily.   aspirin 81 MG chewable tablet Chew 1 tablet by mouth daily.   CALCIUM CARB-CHOLECALCIFEROL PO Take by mouth.   levothyroxine (SYNTHROID) 100 MCG tablet TAKE 1 TABLET BY MOUTH EVERY DAY   lisinopril-hydrochlorothiazide (ZESTORETIC) 20-25 MG tablet Take 1 tablet by mouth daily.   magnesium oxide (MAG-OX) 400 (240 Mg) MG tablet TAKE 1 TABLET BY MOUTH EVERY DAY   metFORMIN (GLUCOPHAGE) 500 MG tablet TAKE 1 TABLET BY MOUTH  EVERY DAY   rosuvastatin (CRESTOR) 20 MG tablet TAKE 1 TABLET BY MOUTH EVERY DAY   sertraline (ZOLOFT) 100 MG tablet Take one tablet 100 mg along  with 50 mg tablet for a total of 150 mg daily.   tirzepatide (MOUNJARO) 7.5 MG/0.5ML Pen Inject 7.5 mg into the skin once a week.   vitamin B-12 (CYANOCOBALAMIN) 1000 MCG tablet Take 5,000 mcg by mouth daily.   No facility-administered encounter medications on file as of 11/24/2021.     Lab Results  Component Value Date   WBC 5.2 05/20/2021   HGB 12.3 05/20/2021   HCT 37.4 05/20/2021   PLT 160.0 05/20/2021   GLUCOSE 98 09/22/2021   CHOL 136 09/22/2021   TRIG 87.0 09/22/2021   HDL 47.00 09/22/2021   LDLCALC 72 09/22/2021   ALT 33 11/22/2021   AST 27 11/22/2021   NA 138 09/22/2021   K 4.1 09/22/2021   CL 102 09/22/2021   CREATININE 1.14 09/22/2021   BUN 22 09/22/2021   CO2 28 09/22/2021   TSH 2.26 09/22/2021   HGBA1C 6.3 09/22/2021       Assessment & Plan:   Problem List Items Addressed This Visit     Anxiety    On zoloft as outlined.  Currently on 120m q day.  Appears to be doing better.  Follow.         DM type 2 with diabetic dyslipidemia (HCC)    Low carb diet and exercise.  Continue metformin and mounjaro.  Has lost weight.  Feels is tolerating.  Follow.  Follow met b and a1c.  Lab Results  Component Value Date   HGBA1C 6.3 09/22/2021       Relevant Orders   Hemoglobin A1c   Hypertension - Primary    Blood pressure as outlined.  Continue lisinopril/hctz.  Follow pressures.  Follow metabolic panel.       Relevant Orders   Basic metabolic panel   Hypothyroid    On thyroid replacement.  Follow tsh.       Major depressive disorder, single episode, mild (HCC)    On zoloft.  Appears to be doing better. Discussed.  Follow.        OSA (obstructive sleep apnea)    Continue cpap.       Other Visit Diagnoses     Screening cholesterol level       Relevant Orders   Lipid panel   Hepatic function panel         CEinar Pheasant MD

## 2021-11-28 ENCOUNTER — Telehealth: Payer: Self-pay | Admitting: Pharmacist

## 2021-11-28 ENCOUNTER — Encounter: Payer: Self-pay | Admitting: Internal Medicine

## 2021-11-28 DIAGNOSIS — E1169 Type 2 diabetes mellitus with other specified complication: Secondary | ICD-10-CM

## 2021-11-28 DIAGNOSIS — F320 Major depressive disorder, single episode, mild: Secondary | ICD-10-CM | POA: Insufficient documentation

## 2021-11-28 DIAGNOSIS — E785 Hyperlipidemia, unspecified: Secondary | ICD-10-CM

## 2021-11-28 MED ORDER — TIRZEPATIDE 10 MG/0.5ML ~~LOC~~ SOAJ: 10.0000 mg | SUBCUTANEOUS | 2 refills | Status: DC

## 2021-11-28 NOTE — Telephone Encounter (Signed)
See MyChart documentation. Mounjaro increased to 10 mg weekly. Patient notified via [place].

## 2021-11-28 NOTE — Assessment & Plan Note (Signed)
Low carb diet and exercise.  Continue metformin and mounjaro.  Has lost weight.  Feels is tolerating.  Follow.  Follow met b and a1c.  Lab Results  Component Value Date   HGBA1C 6.3 09/22/2021

## 2021-11-28 NOTE — Assessment & Plan Note (Signed)
On zoloft.  Appears to be doing better. Discussed.  Follow.

## 2021-11-28 NOTE — Assessment & Plan Note (Signed)
Blood pressure as outlined.  Continue lisinopril/hctz.  Follow pressures.  Follow metabolic panel.

## 2021-11-28 NOTE — Assessment & Plan Note (Signed)
On thyroid replacement.  Follow tsh.  

## 2021-11-28 NOTE — Assessment & Plan Note (Signed)
Continue cpap.  

## 2021-11-28 NOTE — Assessment & Plan Note (Signed)
On zoloft as outlined.  Currently on 150mg  q day.  Appears to be doing better.  Follow.

## 2021-11-30 ENCOUNTER — Encounter: Payer: Self-pay | Admitting: Internal Medicine

## 2021-11-30 ENCOUNTER — Other Ambulatory Visit: Payer: Self-pay | Admitting: Internal Medicine

## 2021-11-30 NOTE — Telephone Encounter (Signed)
I have noted this in your chart.

## 2021-12-02 ENCOUNTER — Telehealth: Payer: Self-pay

## 2021-12-02 ENCOUNTER — Encounter: Payer: Self-pay | Admitting: Internal Medicine

## 2021-12-02 NOTE — Telephone Encounter (Signed)
Pt returned call - asking if there was anything otc to help with neck pain- pt already using Tylenol/Motrin, and icing.   I offered pt appointment to see Dr. Nicki Reaper, pt prefers to wait until next week to see how she feels.  I advised pt she can try OTC muscle rub ( [place], Icy Hot) and see if that helps.  I advised pt that if it does not get better by Monday to please call us and schedule. Pt understands and agrees.

## 2021-12-02 NOTE — Telephone Encounter (Signed)
LM FOR PT TO CB        Tonya Greer  P [facility] (supporting Einar Pheasant, MD) 56 minutes ago (10:41 AM)   VO Dr Nicki Reaper. I have had neck pain/crick for about 4-5 days. I have been alternating Tylenol/Motrin which helps  I have also used ice which helps. Is there anything else I can do to help?  Thanks.

## 2021-12-05 ENCOUNTER — Telehealth: Payer: Self-pay | Admitting: Internal Medicine

## 2021-12-05 NOTE — Addendum Note (Signed)
Addended by: De Hollingshead on: 12/05/2021 11:16 AM   Modules accepted: Orders

## 2021-12-05 NOTE — Telephone Encounter (Signed)
See MyChart message regarding Mounjaro.   Reports more nausea this past week, periodic. Denies vomiting, denies stomach pain. Has been on this 7.5 mg dose for ~ 5 weeks. Denies any dietary changes recently. She did not step up to 10 mg dose as it is on backorder.   We discussed that periodic queasiness can happen. Discussed minimizing fried/fatty foods, moderating portion sizes. Continue Mounjaro 7.5 mg weekly for now, and she will let us know if worsening GI upset.

## 2021-12-05 NOTE — Telephone Encounter (Signed)
See my chart message for triage documentation

## 2021-12-05 NOTE — Telephone Encounter (Signed)
Spoke with patient. She has not had this problem in the past and has not discussed with Dr Nicki Reaper. Has been going on for a week. Not getting any better. Pt requesting muscle relaxer. Advised that evaluation is needed to determine best treatment option. Patient appointment has been moved up to 2/21 at 9:30 with [facility]. Was originally scheduled for 2/22.

## 2021-12-05 NOTE — Telephone Encounter (Signed)
Pt called in stating that she sent Dr. Nicki Reaper a mychart message about her neck pain. Pt stated that she have severe neck pain for about a week. Pt stated she doesn't know what cause the neck pain. Advise Pt of available appt with another provider. Pt stated she couldn't wait until Wednesday. No avail appt. Transfer appt to Access Nurse

## 2021-12-06 ENCOUNTER — Encounter: Payer: Self-pay | Admitting: Family

## 2021-12-06 ENCOUNTER — Other Ambulatory Visit: Payer: Self-pay

## 2021-12-06 ENCOUNTER — Ambulatory Visit (INDEPENDENT_AMBULATORY_CARE_PROVIDER_SITE_OTHER): Payer: Medicare Other

## 2021-12-06 ENCOUNTER — Ambulatory Visit (INDEPENDENT_AMBULATORY_CARE_PROVIDER_SITE_OTHER): Payer: Medicare Other | Admitting: Family

## 2021-12-06 VITALS — BP 126/64 | HR 66 | Temp 98.1°F | Ht 61.0 in | Wt 156.2 lb

## 2021-12-06 DIAGNOSIS — M542 Cervicalgia: Secondary | ICD-10-CM | POA: Diagnosis not present

## 2021-12-06 MED ORDER — TIZANIDINE HCL 2 MG PO CAPS: 2.0000 mg | ORAL_CAPSULE | Freq: Every evening | ORAL | 0 refills | Status: DC | PRN

## 2021-12-06 MED ORDER — PREDNISONE 10 MG PO TABS
ORAL_TABLET | ORAL | 0 refills | Status: DC
Start: 2021-12-06 — End: 2021-12-12

## 2021-12-06 NOTE — Progress Notes (Signed)
Subjective:    Patient ID: Tonya Greer, female    DOB: [birth year], 66 y.o.   MRN: [id number]  CC: Tonya Greer is a 66 y.o. female who presents today for an acute visit.    HPI: Accompanied by husband  Complains of left sided posterior x 1 week Started out as 'crick' Waxes and waned.  No injury, heavy lifting.  Painful to turn her neck to the left. No left shoulder pain No fever, Ha, vision changes, rash.   She has tried tylenol extra strength , heat without relief   Tried ice with relief. 3 motrin with relief.    History of  CKD, osteopenia ( 06/2020), diabetes, hyperlipidemia, hypertension ,OSA Mammogram UTD 07/25/2017  HISTORY:  Past Medical History:  Diagnosis Date   Depression    Diabetes mellitus without complication ([facility])    History of carpal tunnel syndrome    Hyperlipidemia    Hypertension    Hypothyroidism    OSA (obstructive sleep apnea)    Past Surgical History:  Procedure Laterality Date   ABDOMINAL HYSTERECTOMY     ovaries left in place   brreast biopsy     FOOT SURGERY     TONSILLECTOMY AND ADENOIDECTOMY     Family History  Problem Relation Age of Onset   Heart disease Mother     Allergies: Hydrocodone and Oxycodone Current Outpatient Medications on File Prior to Visit  Medication Sig Dispense Refill   ALPHA LIPOIC ACID PO Take 600 mg by mouth daily.     anastrozole (ARIMIDEX) 1 MG tablet Take 1 mg by mouth daily.     aspirin 81 MG chewable tablet Chew 1 tablet by mouth daily.     CALCIUM CARB-CHOLECALCIFEROL PO Take by mouth.     levothyroxine (SYNTHROID) 100 MCG tablet TAKE 1 TABLET BY MOUTH EVERY DAY 90 tablet 1   lisinopril-hydrochlorothiazide (ZESTORETIC) 20-25 MG tablet Take 1 tablet by mouth daily. 90 tablet 1   magnesium oxide (MAG-OX) 400 (240 Mg) MG tablet TAKE 1 TABLET BY MOUTH EVERY DAY 90 tablet 1   metFORMIN (GLUCOPHAGE) 500 MG tablet TAKE 1 TABLET BY MOUTH EVERY DAY 90 tablet 1   rosuvastatin (CRESTOR) 20 MG tablet TAKE 1  TABLET BY MOUTH EVERY DAY 90 tablet 1   sertraline (ZOLOFT) 100 MG tablet Take one tablet 100 mg along with 50 mg tablet for a total of 150 mg daily. 90 tablet 0   sertraline (ZOLOFT) 50 MG tablet Take one tablet 50 mg daily along with the 100 mg tablet to equal 150 mg total. 90 tablet 0   tirzepatide (MOUNJARO) 7.5 MG/0.5ML Pen Inject 7.5 mg into the skin once a week.     vitamin B-12 (CYANOCOBALAMIN) 1000 MCG tablet Take 5,000 mcg by mouth daily.     No current facility-administered medications on file prior to visit.    Social History   Tobacco Use   Smoking status: Never   Smokeless tobacco: Never  Substance Use Topics   Drug use: Never    Review of Systems  Constitutional:  Negative for chills and fever.  Eyes:  Negative for visual disturbance.  Respiratory:  Negative for cough.   Cardiovascular:  Negative for chest pain and palpitations.  Gastrointestinal:  Negative for nausea and vomiting.  Musculoskeletal:  Positive for neck pain and neck stiffness.  Skin:  Negative for rash.  Neurological:  Negative for headaches.     Objective:    BP 126/64 (BP Location: Right Arm, Patient Position: Sitting,  Cuff Size: Normal)    Pulse 66    Temp 98.1 F (36.7 C) (Oral)    Ht 5\' 1"  (1.549 m)    Wt 156 lb 3.2 oz (70.9 kg)    SpO2 99%    BMI 29.51 kg/m    Physical Exam Vitals reviewed.  Constitutional:      Appearance: She is well-developed.  Eyes:     Conjunctiva/sclera: Conjunctivae normal.  Neck:      Comments: Muscular tenderness left sided cervical spine.  No edema, erythema, increased warmth, rash appreciated Cardiovascular:     Rate and Rhythm: Normal rate and regular rhythm.     Pulses: Normal pulses.     Heart sounds: Normal heart sounds.  Pulmonary:     Effort: Pulmonary effort is normal.     Breath sounds: Normal breath sounds. No wheezing, rhonchi or rales.  Musculoskeletal:     Cervical back: Pain with movement (when turns to left) and muscular tenderness  present. Decreased range of motion.  Skin:    General: Skin is warm and dry.  Neurological:     Mental Status: She is alert.  Psychiatric:        Speech: Speech normal.        Behavior: Behavior normal.        Thought Content: Thought content normal.       Assessment & Plan:    Problem List Items Addressed This Visit       Other   Neck pain - Primary    Presentation consistent with degenerative changes cervical spine, muscle spasm. a1c 6.3 and with excellent diabetic control, we opted for trial of prednisone.  I  provided her with zanaflex and advised not to use with alcohol. Advised continued ice/heat, gentle stretching.  The setting of CKD, advised against NSAIDs and counseled on how to appropriately use Tylenol arthritis. Pending cervical xray . Patient will let me know how she is doing.       Relevant Medications   predniSONE (DELTASONE) 10 MG tablet   tizanidine (ZANAFLEX) 2 MG capsule   Other Relevant Orders   DG Cervical Spine Complete     I am having [name] start on predniSONE and tizanidine. I am also having her maintain her aspirin, anastrozole, levothyroxine, lisinopril-hydrochlorothiazide, metFORMIN, ALPHA LIPOIC ACID PO, vitamin B-12, CALCIUM CARB-CHOLECALCIFEROL PO, magnesium oxide, sertraline, sertraline, rosuvastatin, and Mounjaro.   Meds ordered this encounter  Medications   predniSONE (DELTASONE) 10 MG tablet    Sig: Take 4 tablets ( total 40 mg) by mouth for 2 days; take 3 tablets ( total 30 mg) by mouth for 2 days; take 2 tablets ( total 20 mg) by mouth for 1 day; take 1 tablet ( total 10 mg) by mouth for 1 day.    Dispense:  17 tablet    Refill:  0    Order Specific Question:   Supervising Provider    Answer:   Deborra Medina L [[id number]]   tizanidine (ZANAFLEX) 2 MG capsule    Sig: Take 1 capsule (2 mg total) by mouth at bedtime as needed for muscle spasms.    Dispense:  15 capsule    Refill:  0    Order Specific Question:   Supervising Provider     Answer:   Crecencio Mc [[id number]]    Return precautions given.   Risks, benefits, and alternatives of the medications and treatment plan prescribed today were discussed, and patient expressed understanding.   Education regarding symptom  management and diagnosis given to patient on AVS.  Continue to follow with Einar Pheasant, MD for routine health maintenance.   [name] and I agreed with plan.   Mable Paris, FNP

## 2021-12-06 NOTE — Patient Instructions (Addendum)
Start prednisone today and ensure you get all doses and 1 PM this can interfere with sleep.  '  I have given you muscle relaxant, Zanaflex, to use as needed. Please continue to use icing, gentle stretching, warm hot shower.  As discussed, let's start by scheduling Tylenol Arthritis which is a 650mg  tablet .   You may take 1-2 tablets every 8 hours ( scheduled) with maximum of 6 tablets per day.   For example , you could take two tablets in the morning ( 8am) and then two tablets again at 4pm.   Maximum daily dose of acetaminophen 4 g per day from all sources.  If you are taking another medication which includes acetaminophen (Tylenol) which may be in cough and cold preparations or pain medication such as Percocet, you will need to factor that into your total daily dose to be safe.  Please let me know if any questions  Please let me know how you are doing and if symptoms do not completely resolved

## 2021-12-06 NOTE — Assessment & Plan Note (Addendum)
Presentation consistent with degenerative changes cervical spine, muscle spasm. a1c 6.3 and with excellent diabetic control, we opted for trial of prednisone.  I  provided her with zanaflex and advised not to use with alcohol. Advised continued ice/heat, gentle stretching.  The setting of CKD, advised against NSAIDs and counseled on how to appropriately use Tylenol arthritis. Pending cervical xray . Patient will let me know how she is doing.

## 2021-12-07 ENCOUNTER — Ambulatory Visit: Payer: Medicare Other | Admitting: Family

## 2021-12-09 ENCOUNTER — Other Ambulatory Visit: Payer: Self-pay | Admitting: Internal Medicine

## 2021-12-09 ENCOUNTER — Encounter: Payer: Self-pay | Admitting: Family

## 2021-12-09 DIAGNOSIS — I700 Atherosclerosis of aorta: Secondary | ICD-10-CM | POA: Insufficient documentation

## 2021-12-11 ENCOUNTER — Encounter: Payer: Self-pay | Admitting: Family

## 2021-12-12 ENCOUNTER — Other Ambulatory Visit: Payer: Self-pay | Admitting: Family

## 2021-12-12 ENCOUNTER — Other Ambulatory Visit: Payer: Self-pay

## 2021-12-12 DIAGNOSIS — Z780 Asymptomatic menopausal state: Secondary | ICD-10-CM

## 2021-12-12 DIAGNOSIS — M542 Cervicalgia: Secondary | ICD-10-CM

## 2021-12-12 MED ORDER — PREDNISONE 10 MG PO TABS: ORAL_TABLET | ORAL | 0 refills | Status: DC

## 2021-12-12 NOTE — Addendum Note (Signed)
Addended by: Earlyne Iba on: 12/12/2021 01:55 PM   Modules accepted: Orders

## 2021-12-12 NOTE — Telephone Encounter (Signed)
Patient was seen last week on 12/06/2021...was taking prednisone and completed it . Neck pain is better but still hurts, and wants to know if it is ok to do another round of Prednisone.

## 2021-12-13 ENCOUNTER — Encounter: Payer: Self-pay | Admitting: Internal Medicine

## 2021-12-13 ENCOUNTER — Other Ambulatory Visit: Payer: Self-pay

## 2021-12-13 ENCOUNTER — Other Ambulatory Visit: Payer: Self-pay | Admitting: Family

## 2021-12-13 ENCOUNTER — Telehealth: Payer: Self-pay | Admitting: Pharmacist

## 2021-12-13 DIAGNOSIS — M542 Cervicalgia: Secondary | ICD-10-CM

## 2021-12-13 DIAGNOSIS — E1169 Type 2 diabetes mellitus with other specified complication: Secondary | ICD-10-CM

## 2021-12-13 MED ORDER — TIRZEPATIDE 10 MG/0.5ML ~~LOC~~ SOAJ
10.0000 mg | SUBCUTANEOUS | 1 refills | Status: DC
  Filled 2021-12-13 – 2021-12-19 (×2): qty 2, 28d supply, fill #0
  Filled 2022-01-12: qty 2, 28d supply, fill #1

## 2021-12-13 NOTE — Telephone Encounter (Signed)
Per MyChart - script for Mounjaro 10 mg weekly sent to [facility] per patient request.

## 2021-12-19 ENCOUNTER — Ambulatory Visit: Payer: Self-pay | Admitting: Pharmacist

## 2021-12-19 ENCOUNTER — Other Ambulatory Visit: Payer: Self-pay

## 2021-12-19 NOTE — Chronic Care Management (AMB) (Signed)
?  Chronic Care Management  ? ?Note ? ?12/19/2021 ?Name: Tonya Greer MRN: [id number] DOB: [birth year] ? ? ? ?Closing pharmacy CCM case at this time. Will collaborate with Care Guide to outreach to schedule follow up with RN CM. Patient has clinic contact information for future questions or concerns.  ? ?Catie Darnelle Maffucci, PharmD, [place], CPP ?Clinical Pharmacist ?Therapist, music at [facility] [employer] ?[phone] ? ?

## 2021-12-29 ENCOUNTER — Encounter: Payer: Self-pay | Admitting: Internal Medicine

## 2021-12-30 ENCOUNTER — Other Ambulatory Visit: Payer: Self-pay

## 2021-12-30 MED ORDER — LISINOPRIL-HYDROCHLOROTHIAZIDE 20-25 MG PO TABS
1.0000 | ORAL_TABLET | Freq: Every day | ORAL | 1 refills | Status: DC
Start: 2021-12-30 — End: 2022-02-07

## 2022-01-08 DIAGNOSIS — G4733 Obstructive sleep apnea (adult) (pediatric): Secondary | ICD-10-CM | POA: Diagnosis not present

## 2022-01-08 DIAGNOSIS — I10 Essential (primary) hypertension: Secondary | ICD-10-CM | POA: Diagnosis not present

## 2022-01-12 ENCOUNTER — Other Ambulatory Visit: Payer: Self-pay

## 2022-01-19 ENCOUNTER — Telehealth: Payer: Medicare Other

## 2022-01-30 DIAGNOSIS — C50912 Malignant neoplasm of unspecified site of left female breast: Secondary | ICD-10-CM | POA: Diagnosis not present

## 2022-01-30 DIAGNOSIS — D472 Monoclonal gammopathy: Secondary | ICD-10-CM | POA: Diagnosis not present

## 2022-02-01 ENCOUNTER — Other Ambulatory Visit: Payer: Self-pay | Admitting: Internal Medicine

## 2022-02-02 ENCOUNTER — Encounter: Payer: Self-pay | Admitting: Internal Medicine

## 2022-02-06 NOTE — Telephone Encounter (Signed)
Patient called, she was waiting for a response from [facility] message. She is ready to come off of medication. ?

## 2022-02-06 NOTE — Telephone Encounter (Signed)
Given the light headedness and skin sensitivity- see if she can come in tomorrow.  May need to adjust bp medication and can see skin reaction.  ?

## 2022-02-06 NOTE — Telephone Encounter (Signed)
Pt scheduled for tomorrow 4/25 at 10:30am ?

## 2022-02-07 ENCOUNTER — Encounter: Payer: Self-pay | Admitting: Internal Medicine

## 2022-02-07 ENCOUNTER — Ambulatory Visit (INDEPENDENT_AMBULATORY_CARE_PROVIDER_SITE_OTHER): Payer: Medicare Other | Admitting: Internal Medicine

## 2022-02-07 DIAGNOSIS — E039 Hypothyroidism, unspecified: Secondary | ICD-10-CM

## 2022-02-07 DIAGNOSIS — E1169 Type 2 diabetes mellitus with other specified complication: Secondary | ICD-10-CM

## 2022-02-07 DIAGNOSIS — G4733 Obstructive sleep apnea (adult) (pediatric): Secondary | ICD-10-CM

## 2022-02-07 DIAGNOSIS — Z13220 Encounter for screening for lipoid disorders: Secondary | ICD-10-CM | POA: Diagnosis not present

## 2022-02-07 DIAGNOSIS — F320 Major depressive disorder, single episode, mild: Secondary | ICD-10-CM

## 2022-02-07 DIAGNOSIS — R238 Other skin changes: Secondary | ICD-10-CM

## 2022-02-07 DIAGNOSIS — E785 Hyperlipidemia, unspecified: Secondary | ICD-10-CM

## 2022-02-07 DIAGNOSIS — I10 Essential (primary) hypertension: Secondary | ICD-10-CM | POA: Diagnosis not present

## 2022-02-07 DIAGNOSIS — I700 Atherosclerosis of aorta: Secondary | ICD-10-CM

## 2022-02-07 DIAGNOSIS — F419 Anxiety disorder, unspecified: Secondary | ICD-10-CM

## 2022-02-07 LAB — LIPID PANEL
Cholesterol: 136 mg/dL (ref 0–200)
HDL: 54.3 mg/dL (ref >39.00)
LDL Cholesterol: 64 mg/dL (ref 0–99)
NonHDL: 81.96
Total CHOL/HDL Ratio: 3
Triglycerides: 91 mg/dL (ref 0.0–149.0)
VLDL: 18.2 mg/dL (ref 0.0–40.0)

## 2022-02-07 LAB — HEPATIC FUNCTION PANEL
ALT: 19 U/L (ref 0–35)
AST: 19 U/L (ref 0–37)
Albumin: 4.6 g/dL (ref 3.5–5.2)
Alkaline Phosphatase: 44 U/L (ref 39–117)
Bilirubin, Direct: 0.1 mg/dL (ref 0.0–0.3)
Total Bilirubin: 0.6 mg/dL (ref 0.2–1.2)
Total Protein: 7.1 g/dL (ref 6.0–8.3)

## 2022-02-07 LAB — BASIC METABOLIC PANEL
BUN: 22 mg/dL (ref 6–23)
CO2: 27 mEq/L (ref 19–32)
Calcium: 9.5 mg/dL (ref 8.4–10.5)
Chloride: 101 mEq/L (ref 96–112)
Creatinine, Ser: 1.01 mg/dL (ref 0.40–1.20)
GFR: 57.85 mL/min — ABNORMAL LOW (ref >60.00)
Glucose, Bld: 88 mg/dL (ref 70–99)
Potassium: 3.7 mEq/L (ref 3.5–5.1)
Sodium: 138 mEq/L (ref 135–145)

## 2022-02-07 LAB — HEMOGLOBIN A1C: Hgb A1c MFr Bld: 6.2 % (ref 4.6–6.5)

## 2022-02-07 MED ORDER — LISINOPRIL 20 MG PO TABS: 20.0000 mg | ORAL_TABLET | Freq: Every day | ORAL | 3 refills | Status: DC

## 2022-02-07 MED ORDER — TIRZEPATIDE 5 MG/0.5ML ~~LOC~~ SOAJ
5.0000 mg | SUBCUTANEOUS | 1 refills | Status: DC
Start: 2022-02-07 — End: 2022-03-29

## 2022-02-07 NOTE — Progress Notes (Signed)
Patient ID: Tonya Greer, female   DOB: 06/06/1956, 66 y.o.   MRN: [id number] ? ? ?Subjective:  ? ? Patient ID: Tonya Greer, female    DOB: Nov 04, 1955, 66 y.o.   MRN: [id number] ? ?Patient here for work in appt.   ? ?Chief Complaint  ?Patient presents with  ? Follow-up  ?  F/u for side effects to medication ? ?Lightheadedness, skin sensitivity - touch sensitivity mostly.   ? .  ? ?HPI ?Messaged - concerned regarding possible reaction to mounjaro.  Noticed skin sensitivity.  No rash.  Skin sore to touch.  From knee to breast - right side - increased sensitivity.  Touch cold - hurts.  Was questioning if mounjaro could be causing.  On higher dose recently.  No chest pain or sob reported.  No abdominal pain.  Bowels moving.  Did notice light headedness - occasional.  Notices when stands.  Recent blood pressure low.  Discussed medication adjustment.   ? ? ?Past Medical History:  ?Diagnosis Date  ? Depression   ? Diabetes mellitus without complication ([place])   ? History of carpal tunnel syndrome   ? Hyperlipidemia   ? Hypertension   ? Hypothyroidism   ? OSA (obstructive sleep apnea)   ? ?Past Surgical History:  ?Procedure Laterality Date  ? ABDOMINAL HYSTERECTOMY    ? ovaries left in place  ? brreast biopsy    ? FOOT SURGERY    ? TONSILLECTOMY AND ADENOIDECTOMY    ? ?Family History  ?Problem Relation Age of Onset  ? Heart disease Mother   ? ?Social History  ? ?Socioeconomic History  ? Marital status: Married  ?  Spouse name: Not on file  ? Number of children: 2  ? Years of education: Not on file  ? Highest education level: Not on file  ?Occupational History  ? Not on file  ?Tobacco Use  ? Smoking status: Never  ? Smokeless tobacco: Never  ?Substance and Sexual Activity  ? Alcohol use: Not on file  ? Drug use: Never  ? Sexual activity: Not on file  ?Other Topics Concern  ? Not on file  ?Social History Narrative  ? Not on file  ? ?Social Determinants of Health  ? ?Financial Resource Strain: Low Risk   ? Difficulty of Paying  Living Expenses: Not hard at all  ?Food Insecurity: No Food Insecurity  ? Worried About Charity fundraiser in the Last Year: Never true  ? Ran Out of Food in the Last Year: Never true  ?Transportation Needs: No Transportation Needs  ? Lack of Transportation (Medical): No  ? Lack of Transportation (Non-Medical): No  ?Physical Activity: Not on file  ?Stress: No Stress Concern Present  ? Feeling of Stress : Not at all  ?Social Connections: Unknown  ? Frequency of Communication with Friends and Family: Not on file  ? Frequency of Social Gatherings with Friends and Family: Not on file  ? Attends Religious Services: Not on file  ? Active Member of Clubs or Organizations: Not on file  ? Attends Archivist Meetings: Not on file  ? Marital Status: Married  ? ? ? ?Review of Systems  ?Constitutional:  Negative for appetite change and unexpected weight change.  ?HENT:  Negative for congestion and sinus pressure.   ?Respiratory:  Negative for cough, chest tightness and shortness of breath.   ?Cardiovascular:  Negative for chest pain, palpitations and leg swelling.  ?Gastrointestinal:  Negative for abdominal pain, diarrhea, nausea and vomiting.  ?  Genitourinary:  Negative for difficulty urinating and dysuria.  ?Musculoskeletal:  Negative for joint swelling and myalgias.  ?Skin:  Negative for color change and rash.  ?Neurological:  Positive for light-headedness. Negative for dizziness and headaches.  ?Psychiatric/Behavioral:  Negative for agitation and dysphoric mood.   ? ?   ?Objective:  ?  ? ?BP 108/70 (BP Location: Left Arm, Patient Position: Sitting, Cuff Size: Small)   Pulse 75   Temp 98.3 ?F (36.8 ?C) (Temporal)   Resp 19   Ht 5' 1"  (1.549 m)   Wt 147 lb (66.7 kg)   SpO2 100%   BMI 27.78 kg/m?  ?Wt Readings from Last 3 Encounters:  ?02/07/22 147 lb (66.7 kg)  ?12/06/21 156 lb 3.2 oz (70.9 kg)  ?11/24/21 157 lb 9.6 oz (71.5 kg)  ? ? ?Physical Exam ?Vitals reviewed.  ?Constitutional:   ?   General: She is not  in acute distress. ?   Appearance: Normal appearance.  ?HENT:  ?   Head: Normocephalic and atraumatic.  ?   Right Ear: External ear normal.  ?   Left Ear: External ear normal.  ?Eyes:  ?   General: No scleral icterus.    ?   Right eye: No discharge.     ?   Left eye: No discharge.  ?   Conjunctiva/sclera: Conjunctivae normal.  ?Neck:  ?   Thyroid: No thyromegaly.  ?Cardiovascular:  ?   Rate and Rhythm: Normal rate and regular rhythm.  ?Pulmonary:  ?   Effort: No respiratory distress.  ?   Breath sounds: Normal breath sounds. No wheezing.  ?Abdominal:  ?   General: Bowel sounds are normal.  ?   Palpations: Abdomen is soft.  ?   Tenderness: There is no abdominal tenderness.  ?Musculoskeletal:     ?   General: No swelling or tenderness.  ?   Cervical back: Neck supple. No tenderness.  ?Lymphadenopathy:  ?   Cervical: No cervical adenopathy.  ?Skin: ?   Findings: No erythema or rash.  ?Neurological:  ?   Mental Status: She is alert.  ?Psychiatric:     ?   Mood and Affect: Mood normal.     ?   Behavior: Behavior normal.  ? ? ? ?Outpatient Encounter Medications as of 02/07/2022  ?Medication Sig  ? ALPHA LIPOIC ACID PO Take 600 mg by mouth daily.  ? anastrozole (ARIMIDEX) 1 MG tablet Take 1 mg by mouth daily.  ? aspirin 81 MG chewable tablet Chew 1 tablet by mouth daily.  ? CALCIUM CARB-CHOLECALCIFEROL PO Take by mouth.  ? levothyroxine (SYNTHROID) 100 MCG tablet TAKE 1 TABLET BY MOUTH EVERY DAY  ? lisinopril (ZESTRIL) 20 MG tablet Take 1 tablet (20 mg total) by mouth daily.  ? magnesium oxide (MAG-OX) 400 (240 Mg) MG tablet TAKE 1 TABLET BY MOUTH EVERY DAY  ? metFORMIN (GLUCOPHAGE) 500 MG tablet TAKE 1 TABLET BY MOUTH EVERY DAY  ? rosuvastatin (CRESTOR) 20 MG tablet TAKE 1 TABLET BY MOUTH EVERY DAY  ? tirzepatide [facility]) 5 MG/0.5ML Pen Inject 5 mg into the skin once a week.  ? vitamin B-12 (CYANOCOBALAMIN) 1000 MCG tablet Take 5,000 mcg by mouth daily.  ? [DISCONTINUED] lisinopril-hydrochlorothiazide (ZESTORETIC) 20-25  MG tablet Take 1 tablet by mouth daily.  ? [DISCONTINUED] sertraline (ZOLOFT) 100 MG tablet Take one tablet 100 mg along with 50 mg tablet for a total of 150 mg daily.  ? [DISCONTINUED] sertraline (ZOLOFT) 50 MG tablet Take one tablet 50 mg  daily along with the 100 mg tablet to equal 150 mg total.  ? [DISCONTINUED] tirzepatide (MOUNJARO) 10 MG/0.5ML Pen Inject 10 mg into the skin once a week.  ? [DISCONTINUED] predniSONE (DELTASONE) 10 MG tablet Take 4 tablets ( total 40 mg) by mouth for 2 days; take 3 tablets ( total 30 mg) by mouth for 2 days; take 2 tablets ( total 20 mg) by mouth for 1 day; take 1 tablet ( total 10 mg) by mouth for 1 day.  ? [DISCONTINUED] tizanidine (ZANAFLEX) 2 MG capsule Take 1 capsule (2 mg total) by mouth at bedtime as needed for muscle spasms.  ? ?No facility-administered encounter medications on file as of 02/07/2022.  ?  ? ?Lab Results  ?Component Value Date  ? WBC 5.2 05/20/2021  ? HGB 12.3 05/20/2021  ? HCT 37.4 05/20/2021  ? PLT 160.0 05/20/2021  ? GLUCOSE 88 02/07/2022  ? CHOL 136 02/07/2022  ? TRIG 91.0 02/07/2022  ? HDL 54.30 02/07/2022  ? [place] 64 02/07/2022  ? ALT 19 02/07/2022  ? AST 19 02/07/2022  ? NA 138 02/07/2022  ? K 3.7 02/07/2022  ? CL 101 02/07/2022  ? CREATININE 1.01 02/07/2022  ? BUN 22 02/07/2022  ? CO2 27 02/07/2022  ? TSH 2.26 09/22/2021  ? HGBA1C 6.2 02/07/2022  ? ? ?   ?Assessment & Plan:  ? ?Problem List Items Addressed This Visit   ? ? Anxiety  ?  On zoloft as outlined.  Currently on 127m q day.   Follow.    ? ?  ?  ? Aortic atherosclerosis ([place]  ?  Continue crestor.  ? ?  ?  ? Relevant Medications  ? lisinopril (ZESTRIL) 20 MG tablet  ? DM type 2 with diabetic dyslipidemia ([place]  ?  Low carb diet and exercise.  Continue metformin and mounjaro.  Has lost weight.  At desired weight for her.  Will decrease mounjaro to 522m  Follow.  Follow met b and a1c.  Notify me if skin sensitivity persists.  No rash.   ?Lab Results  ?Component Value Date  ? HGBA1C 6.2  02/07/2022  ?  ?  ? Relevant Medications  ? lisinopril (ZESTRIL) 20 MG tablet  ? tirzepatide (MOUNJARO) 5 MG/0.5ML Pen  ? Hypertension  ?  Blood pressure as outlined.  Reports some light headedness and low blood

## 2022-02-08 ENCOUNTER — Other Ambulatory Visit: Payer: Self-pay

## 2022-02-08 DIAGNOSIS — G4733 Obstructive sleep apnea (adult) (pediatric): Secondary | ICD-10-CM | POA: Diagnosis not present

## 2022-02-08 DIAGNOSIS — I10 Essential (primary) hypertension: Secondary | ICD-10-CM | POA: Diagnosis not present

## 2022-02-09 ENCOUNTER — Other Ambulatory Visit: Payer: Medicare Other

## 2022-02-12 ENCOUNTER — Encounter: Payer: Self-pay | Admitting: Internal Medicine

## 2022-02-13 ENCOUNTER — Ambulatory Visit: Payer: Medicare Other | Admitting: Internal Medicine

## 2022-02-13 MED ORDER — SERTRALINE HCL 100 MG PO TABS
ORAL_TABLET | ORAL | 1 refills | Status: DC
Start: 2022-02-13 — End: 2022-06-30

## 2022-02-13 MED ORDER — SERTRALINE HCL 50 MG PO TABS: ORAL_TABLET | ORAL | 1 refills | Status: DC

## 2022-02-18 ENCOUNTER — Encounter: Payer: Self-pay | Admitting: Internal Medicine

## 2022-02-18 DIAGNOSIS — R238 Other skin changes: Secondary | ICD-10-CM | POA: Insufficient documentation

## 2022-02-18 NOTE — Assessment & Plan Note (Signed)
On thyroid replacement.  Follow tsh.  

## 2022-02-18 NOTE — Assessment & Plan Note (Addendum)
On zoloft as outlined.  Currently on '150mg'$  q day.   Follow.    ?

## 2022-02-18 NOTE — Assessment & Plan Note (Signed)
Unclear as to exact etiology.  Doubt mounjaro given one side.  No rash.  Will decrease dose as outlined.  Follow.  Notify me if persistent symptoms.   ?

## 2022-02-18 NOTE — Assessment & Plan Note (Signed)
On zoloft.  Appears to be relatively stable. Follow.  

## 2022-02-18 NOTE — Assessment & Plan Note (Signed)
Blood pressure as outlined.  Reports some light headedness and low blood pressure recently.  Currently on lisinopril/hctz.  Will change to just lisinopril '20mg'$  q day.  Follow pressures.  Follow metabolic panel.  ?

## 2022-02-18 NOTE — Assessment & Plan Note (Signed)
Continue crestor 

## 2022-02-18 NOTE — Assessment & Plan Note (Addendum)
Low carb diet and exercise.  Continue metformin and mounjaro.  Has lost weight.  At desired weight for her.  Will decrease mounjaro to 23m.  Follow.  Follow met b and a1c.  Notify me if skin sensitivity persists.  No rash.   ?Lab Results  ?Component Value Date  ? HGBA1C 6.2 02/07/2022  ? ?

## 2022-02-18 NOTE — Assessment & Plan Note (Signed)
Continue cpap.  

## 2022-02-27 DIAGNOSIS — C50912 Malignant neoplasm of unspecified site of left female breast: Secondary | ICD-10-CM | POA: Diagnosis not present

## 2022-03-06 ENCOUNTER — Encounter: Payer: Self-pay | Admitting: Internal Medicine

## 2022-03-06 ENCOUNTER — Ambulatory Visit (INDEPENDENT_AMBULATORY_CARE_PROVIDER_SITE_OTHER): Payer: Medicare Other | Admitting: Internal Medicine

## 2022-03-06 DIAGNOSIS — E785 Hyperlipidemia, unspecified: Secondary | ICD-10-CM

## 2022-03-06 DIAGNOSIS — E1169 Type 2 diabetes mellitus with other specified complication: Secondary | ICD-10-CM | POA: Diagnosis not present

## 2022-03-06 DIAGNOSIS — F419 Anxiety disorder, unspecified: Secondary | ICD-10-CM

## 2022-03-06 DIAGNOSIS — I700 Atherosclerosis of aorta: Secondary | ICD-10-CM | POA: Diagnosis not present

## 2022-03-06 DIAGNOSIS — R238 Other skin changes: Secondary | ICD-10-CM

## 2022-03-06 DIAGNOSIS — I10 Essential (primary) hypertension: Secondary | ICD-10-CM

## 2022-03-06 DIAGNOSIS — E039 Hypothyroidism, unspecified: Secondary | ICD-10-CM

## 2022-03-06 DIAGNOSIS — G4733 Obstructive sleep apnea (adult) (pediatric): Secondary | ICD-10-CM

## 2022-03-06 DIAGNOSIS — F320 Major depressive disorder, single episode, mild: Secondary | ICD-10-CM

## 2022-03-06 DIAGNOSIS — D649 Anemia, unspecified: Secondary | ICD-10-CM

## 2022-03-06 NOTE — Progress Notes (Signed)
Patient ID: Tonya Greer, female   DOB: 1956/08/18, 66 y.o.   MRN: [id number]   Subjective:    Patient ID: Tonya Greer, female    DOB: 05-23-1956, 66 y.o.   MRN: [id number]   Patient here for follow up appt.   Chief Complaint  Patient presents with   Follow-up    Follow up for Skin sensitivity   .   HPI Was seen recently with concerns regarding skin sensitivity.  Concern was related to mounjaro.  Decreased dose.  Did not change skin sensation.  Has done well on mounjaro.  Has lost weight.  Is in doughnut hole.  Question of other options.  No chest pain or sob reported.  No abdominal pain.  Bowels moving.  Off armidex.  On zoloft.  Depression better.  Doing well on just lisinopril 75m q day.  Off hctz. Discussed recent hematology evaluation.  Raised question of anemia.  Discussed could be related to her decreased po intake.     Past Medical History:  Diagnosis Date   Depression    Diabetes mellitus without complication (HCC)    History of carpal tunnel syndrome    Hyperlipidemia    Hypertension    Hypothyroidism    OSA (obstructive sleep apnea)    Past Surgical History:  Procedure Laterality Date   ABDOMINAL HYSTERECTOMY     ovaries left in place   brreast biopsy     FOOT SURGERY     TONSILLECTOMY AND ADENOIDECTOMY     Family History  Problem Relation Age of Onset   Heart disease Mother    Social History   Socioeconomic History   Marital status: Married    Spouse name: Not on file   Number of children: 2   Years of education: Not on file   Highest education level: Not on file  Occupational History   Not on file  Tobacco Use   Smoking status: Never   Smokeless tobacco: Never  Substance and Sexual Activity   Alcohol use: Not on file   Drug use: Never   Sexual activity: Not on file  Other Topics Concern   Not on file  Social History Narrative   Not on file   Social Determinants of Health   Financial Resource Strain: Low Risk    Difficulty of Paying  Living Expenses: Not hard at all  Food Insecurity: No Food Insecurity   Worried About RCharity fundraiserin the Last Year: Never true   Ran Out of Food in the Last Year: Never true  Transportation Needs: No Transportation Needs   Lack of Transportation (Medical): No   Lack of Transportation (Non-Medical): No  Physical Activity: Not on file  Stress: No Stress Concern Present   Feeling of Stress : Not at all  Social Connections: Unknown   Frequency of Communication with Friends and Family: Not on file   Frequency of Social Gatherings with Friends and Family: Not on file   Attends Religious Services: Not on file   Active Member of Clubs or Organizations: Not on file   Attends CArchivistMeetings: Not on file   Marital Status: Married     Review of Systems  Constitutional:  Negative for appetite change and unexpected weight change.  HENT:  Negative for congestion and sinus pressure.   Respiratory:  Negative for cough, chest tightness and shortness of breath.   Cardiovascular:  Negative for chest pain and palpitations.  Gastrointestinal:  Negative for abdominal pain, diarrhea, nausea and  vomiting.  Genitourinary:  Negative for difficulty urinating and dysuria.  Musculoskeletal:  Negative for joint swelling and myalgias.  Skin:  Negative for color change and rash.  Neurological:  Negative for dizziness, light-headedness and headaches.  Psychiatric/Behavioral:  Negative for agitation and dysphoric mood.       Objective:     BP 120/66 (BP Location: Left Arm, Patient Position: Sitting, Cuff Size: Small)   Pulse 62   Temp 98 F (36.7 C) (Temporal)   Resp 13   Ht _0  (1.549 m)   Wt 150 lb 6.4 oz (68.2 kg)   SpO2 98%   BMI 28.42 kg/m  Wt Readings from Last 3 Encounters:  03/06/22 150 lb 6.4 oz (68.2 kg)  02/07/22 147 lb (66.7 kg)  12/06/21 156 lb 3.2 oz (70.9 kg)    Physical Exam Vitals reviewed.  Constitutional:      General: She is not in acute distress.     Appearance: Normal appearance.  HENT:     Head: Normocephalic and atraumatic.     Right Ear: External ear normal.     Left Ear: External ear normal.  Eyes:     General: No scleral icterus.       Right eye: No discharge.        Left eye: No discharge.     Conjunctiva/sclera: Conjunctivae normal.  Neck:     Thyroid: No thyromegaly.  Cardiovascular:     Rate and Rhythm: Normal rate and regular rhythm.  Pulmonary:     Effort: No respiratory distress.     Breath sounds: Normal breath sounds. No wheezing.  Abdominal:     General: Bowel sounds are normal.     Palpations: Abdomen is soft.     Tenderness: There is no abdominal tenderness.  Musculoskeletal:        General: No swelling or tenderness.     Cervical back: Neck supple. No tenderness.  Lymphadenopathy:     Cervical: No cervical adenopathy.  Skin:    Findings: No erythema or rash.  Neurological:     Mental Status: She is alert.  Psychiatric:        Mood and Affect: Mood normal.        Behavior: Behavior normal.     Outpatient Encounter Medications as of 03/06/2022  Medication Sig   ALPHA LIPOIC ACID PO Take 600 mg by mouth daily.   anastrozole (ARIMIDEX) 1 MG tablet Take 1 mg by mouth daily.   aspirin 81 MG chewable tablet Chew 1 tablet by mouth daily.   CALCIUM CARB-CHOLECALCIFEROL PO Take by mouth.   levothyroxine (SYNTHROID) 100 MCG tablet TAKE 1 TABLET BY MOUTH EVERY DAY   lisinopril (ZESTRIL) 20 MG tablet Take 1 tablet (20 mg total) by mouth daily.   magnesium oxide (MAG-OX) 400 (240 Mg) MG tablet TAKE 1 TABLET BY MOUTH EVERY DAY   metFORMIN (GLUCOPHAGE) 500 MG tablet TAKE 1 TABLET BY MOUTH EVERY DAY   rosuvastatin (CRESTOR) 20 MG tablet TAKE 1 TABLET BY MOUTH EVERY DAY   sertraline (ZOLOFT) 100 MG tablet Take one tablet 100 mg along with 50 mg tablet for a total of 150 mg daily.   sertraline (ZOLOFT) 50 MG tablet Take one tablet 50 mg daily along with the 100 mg tablet to equal 150 mg total.   tirzepatide  (MOUNJARO) 5 MG/0.5ML Pen Inject 5 mg into the skin once a week.   vitamin B-12 (CYANOCOBALAMIN) 1000 MCG tablet Take 5,000 mcg by mouth daily.  No facility-administered encounter medications on file as of 03/06/2022.     Lab Results  Component Value Date   WBC 5.2 05/20/2021   HGB 12.3 05/20/2021   HCT 37.4 05/20/2021   PLT 160.0 05/20/2021   GLUCOSE 88 02/07/2022   CHOL 136 02/07/2022   TRIG 91.0 02/07/2022   HDL 54.30 02/07/2022   LDLCALC 64 02/07/2022   ALT 19 02/07/2022   AST 19 02/07/2022   NA 138 02/07/2022   K 3.7 02/07/2022   CL 101 02/07/2022   CREATININE 1.01 02/07/2022   BUN 22 02/07/2022   CO2 27 02/07/2022   TSH 2.26 09/22/2021   HGBA1C 6.2 02/07/2022       Assessment & Plan:   Problem List Items Addressed This Visit     Anemia    Found on recent lab check.  Trying to adjust diet.  Recheck cbc and iron studies.        Relevant Orders   CBC with Differential/Platelet   Vitamin B12   IBC + Ferritin   Anxiety    On zoloft as outlined.  Currently on 171m q day.  Appears to be stable.  Follow.          Aortic atherosclerosis (H[facility])    Continue crestor.        DM type 2 with diabetic dyslipidemia ([facility])    Low carb diet and exercise.  Continue metformin and mounjaro.  Has lost weight.  Follow met b and a1c.  Lab Results  Component Value Date   HGBA1C 6.2 02/07/2022       Hypertension    Blood pressure doing well on lisinopril 25mq day.  Follow pressures.  No changes in medication.  Follow metabolic panel.        Hypothyroid    On thyroid replacement.  Follow tsh.        Major depressive disorder, single episode, mild (HCC)    On zoloft.  Appears to be relatively stable. Follow.        OSA (obstructive sleep apnea)    Continue cpap.        Skin sensitivity    Persistent sensitivity.  Unclear etiology.  No rash.  May need further evaluation - derm/allergy.  Follow.          ChEinar PheasantMD

## 2022-03-08 DIAGNOSIS — C50412 Malignant neoplasm of upper-outer quadrant of left female breast: Secondary | ICD-10-CM | POA: Diagnosis not present

## 2022-03-13 ENCOUNTER — Encounter: Payer: Self-pay | Admitting: Internal Medicine

## 2022-03-13 DIAGNOSIS — D649 Anemia, unspecified: Secondary | ICD-10-CM | POA: Insufficient documentation

## 2022-03-13 NOTE — Assessment & Plan Note (Signed)
Persistent sensitivity.  Unclear etiology.  No rash.  May need further evaluation - derm/allergy.  Follow.

## 2022-03-13 NOTE — Assessment & Plan Note (Signed)
On thyroid replacement.  Follow tsh.  

## 2022-03-13 NOTE — Assessment & Plan Note (Signed)
On zoloft as outlined.  Currently on '150mg'$  q day.  Appears to be stable.  Follow.

## 2022-03-13 NOTE — Assessment & Plan Note (Signed)
Low carb diet and exercise.  Continue metformin and mounjaro.  Has lost weight.  Follow met b and a1c.  Lab Results  Component Value Date   HGBA1C 6.2 02/07/2022

## 2022-03-13 NOTE — Assessment & Plan Note (Signed)
Blood pressure doing well on lisinopril 20mg q day.  Follow pressures.  No changes in medication.  Follow metabolic panel.  

## 2022-03-13 NOTE — Assessment & Plan Note (Signed)
Continue cpap.  

## 2022-03-13 NOTE — Assessment & Plan Note (Signed)
Continue crestor 

## 2022-03-13 NOTE — Assessment & Plan Note (Signed)
Found on recent lab check.  Trying to adjust diet.  Recheck cbc and iron studies.

## 2022-03-13 NOTE — Assessment & Plan Note (Signed)
On zoloft.  Appears to be relatively stable. Follow.  

## 2022-03-23 DIAGNOSIS — M810 Age-related osteoporosis without current pathological fracture: Secondary | ICD-10-CM | POA: Diagnosis not present

## 2022-03-23 DIAGNOSIS — C50412 Malignant neoplasm of upper-outer quadrant of left female breast: Secondary | ICD-10-CM | POA: Diagnosis not present

## 2022-03-23 DIAGNOSIS — Z170 Estrogen receptor positive status [ER+]: Secondary | ICD-10-CM | POA: Diagnosis not present

## 2022-03-27 ENCOUNTER — Encounter: Payer: Self-pay | Admitting: Internal Medicine

## 2022-03-29 ENCOUNTER — Other Ambulatory Visit: Payer: Self-pay | Admitting: Internal Medicine

## 2022-03-29 ENCOUNTER — Other Ambulatory Visit: Payer: Self-pay

## 2022-03-29 MED ORDER — TIRZEPATIDE 7.5 MG/0.5ML ~~LOC~~ SOAJ
7.5000 mg | SUBCUTANEOUS | 3 refills | Status: DC
  Filled 2022-04-07: qty 2, 28d supply, fill #0
  Filled 2022-04-30: qty 2, 28d supply, fill #1
  Filled 2022-05-28: qty 2, 28d supply, fill #2
  Filled 2022-06-25: qty 2, 28d supply, fill #3

## 2022-03-29 NOTE — Telephone Encounter (Signed)
Please call Tonya Greer.  There was concern previously about her decreased po intake and anemia.  How is her appetite?  Any GI symptoms?

## 2022-03-29 NOTE — Telephone Encounter (Signed)
Ok to send in 7.'5mg'$  mounjaro, but she needs to keep [place] posted and let us know if any problems.

## 2022-03-30 ENCOUNTER — Telehealth: Payer: Self-pay | Admitting: Pharmacist

## 2022-03-30 NOTE — Progress Notes (Signed)
[facility])  [place] Team    03/30/2022  Tonya Greer [birth year] [id number]  Reason for referral: Medication Assistance  Referral source: Dr. Nicki Reaper Current insurance: [facility] [place]   Outreach:  Successful telephone call with Tonya Greer.  HIPAA identifiers verified.   Objective: The ASCVD Risk score (Arnett DK, et al., 2019) failed to calculate for the following reasons:   Unable to determine if patient is Non-Hispanic African American  Lab Results  Component Value Date   CREATININE 1.01 02/07/2022   CREATININE 1.14 09/22/2021   CREATININE 1.10 05/20/2021    Lab Results  Component Value Date   HGBA1C 6.2 02/07/2022    Lipid Panel     Component Value Date/Time   CHOL 136 02/07/2022 1115   TRIG 91.0 02/07/2022 1115   HDL 54.30 02/07/2022 1115   CHOLHDL 3 02/07/2022 1115   VLDL 18.2 02/07/2022 1115   LDLCALC 64 02/07/2022 1115    BP Readings from Last 3 Encounters:  03/06/22 120/66  02/07/22 108/70  12/06/21 126/64    Allergies  Allergen Reactions   Hydrocodone Itching   Oxycodone Itching    Assessment: Spoke with Tonya Greer via telephone, patient informed me that she has reached the Medicare coverage gap for 2023. Cost of Tonya Greer is becoming cost prohibitive. Discussed with patient, currently no patient assistance program available for Tonya Greer). Discussed Ozempic as a possible alternative medication. Reviewed household income threshold for Ozempic through [employer] with Tonya Greer. She will reach back out to me if she and spouse qualify for the Ozempic patient assistance program through [employer] based on household income. Provided my name and direct call back number, [phone].    Plan: Will route note to Dr. Nicki Reaper.  Will close [facility] pharmacy case as no further medication needs identified at this time.  Am happy to assist in the future as needed.    Tonya Greer, PharmD [place] Pharmacist Office: [phone]

## 2022-04-03 ENCOUNTER — Other Ambulatory Visit (INDEPENDENT_AMBULATORY_CARE_PROVIDER_SITE_OTHER): Payer: Medicare Other

## 2022-04-03 DIAGNOSIS — D649 Anemia, unspecified: Secondary | ICD-10-CM | POA: Diagnosis not present

## 2022-04-03 LAB — CBC WITH DIFFERENTIAL/PLATELET
Basophils Absolute: 0 10*3/uL (ref 0.0–0.1)
Basophils Relative: 0.7 % (ref 0.0–3.0)
Eosinophils Absolute: 0.2 10*3/uL (ref 0.0–0.7)
Eosinophils Relative: 4.1 % (ref 0.0–5.0)
HCT: 35.3 % — ABNORMAL LOW (ref 36.0–46.0)
Hemoglobin: 11.8 g/dL — ABNORMAL LOW (ref 12.0–15.0)
Lymphocytes Relative: 13 % (ref 12.0–46.0)
Lymphs Abs: 0.7 10*3/uL (ref 0.7–4.0)
MCHC: 33.4 g/dL (ref 30.0–36.0)
MCV: 97.2 fl (ref 78.0–100.0)
Monocytes Absolute: 0.4 10*3/uL (ref 0.1–1.0)
Monocytes Relative: 8.3 % (ref 3.0–12.0)
Neutro Abs: 3.7 10*3/uL (ref 1.4–7.7)
Neutrophils Relative %: 73.9 % (ref 43.0–77.0)
Platelets: 155 10*3/uL (ref 150.0–400.0)
RBC: 3.63 Mil/uL — ABNORMAL LOW (ref 3.87–5.11)
RDW: 14 % (ref 11.5–15.5)
WBC: 5.1 10*3/uL (ref 4.0–10.5)

## 2022-04-03 LAB — IBC + FERRITIN
Ferritin: 46.3 ng/mL (ref 10.0–291.0)
Iron: 49 ug/dL (ref 42–145)
Saturation Ratios: 13.9 % — ABNORMAL LOW (ref 20.0–50.0)
TIBC: 351.4 ug/dL (ref 250.0–450.0)
Transferrin: 251 mg/dL (ref 212.0–360.0)

## 2022-04-03 LAB — VITAMIN B12: Vitamin B-12: >1504 pg/mL — ABNORMAL HIGH (ref 211–911)

## 2022-04-07 ENCOUNTER — Other Ambulatory Visit: Payer: Self-pay

## 2022-04-17 ENCOUNTER — Ambulatory Visit (INDEPENDENT_AMBULATORY_CARE_PROVIDER_SITE_OTHER): Payer: Medicare Other

## 2022-04-17 VITALS — Ht 61.0 in | Wt 150.0 lb

## 2022-04-17 DIAGNOSIS — Z0000 Encounter for general adult medical examination without abnormal findings: Secondary | ICD-10-CM | POA: Diagnosis not present

## 2022-04-17 NOTE — Progress Notes (Signed)
Subjective:   Tonya Greer is a 66 y.o. female who presents for Medicare Annual (Subsequent) preventive examination.  Review of Systems    No ROS.  Medicare Wellness Virtual Visit.  Visual/audio telehealth visit, UTA vital signs.   See social history for additional risk factors.         Objective:    Today's Vitals   04/17/22 1104  Weight: 150 lb (68 kg)  Height: '5\' 1"'$  (1.549 m)   Body mass index is 28.34 kg/m.     04/07/2021   12:26 PM  Advanced Directives  Does Patient Have a Medical Advance Directive? Yes  Does patient want to make changes to medical advance directive? No - Patient declined    Current Medications (verified) Outpatient Encounter Medications as of 04/17/2022  Medication Sig   ALPHA LIPOIC ACID PO Take 600 mg by mouth daily.   aspirin 81 MG chewable tablet Chew 1 tablet by mouth daily.   CALCIUM CARB-CHOLECALCIFEROL PO Take by mouth.   levothyroxine (SYNTHROID) 100 MCG tablet TAKE 1 TABLET BY MOUTH EVERY DAY   lisinopril (ZESTRIL) 20 MG tablet Take 1 tablet (20 mg total) by mouth daily.   magnesium oxide (MAG-OX) 400 (240 Mg) MG tablet TAKE 1 TABLET BY MOUTH EVERY DAY   metFORMIN (GLUCOPHAGE) 500 MG tablet TAKE 1 TABLET BY MOUTH EVERY DAY   rosuvastatin (CRESTOR) 20 MG tablet TAKE 1 TABLET BY MOUTH EVERY DAY   sertraline (ZOLOFT) 100 MG tablet Take one tablet 100 mg along with 50 mg tablet for a total of 150 mg daily.   sertraline (ZOLOFT) 50 MG tablet Take one tablet 50 mg daily along with the 100 mg tablet to equal 150 mg total.   tirzepatide (MOUNJARO) 7.5 MG/0.5ML Pen Inject 7.5 mg into the skin once a week.   vitamin B-12 (CYANOCOBALAMIN) 1000 MCG tablet Take 5,000 mcg by mouth daily.   [DISCONTINUED] anastrozole (ARIMIDEX) 1 MG tablet Take 1 mg by mouth daily.   No facility-administered encounter medications on file as of 04/17/2022.    Allergies (verified) Hydrocodone and Oxycodone   History: Past Medical History:  Diagnosis Date    Depression    Diabetes mellitus without complication (HCC)    History of carpal tunnel syndrome    Hyperlipidemia    Hypertension    Hypothyroidism    OSA (obstructive sleep apnea)    Past Surgical History:  Procedure Laterality Date   ABDOMINAL HYSTERECTOMY     ovaries left in place   brreast biopsy     FOOT SURGERY     TONSILLECTOMY AND ADENOIDECTOMY     Family History  Problem Relation Age of Onset   Heart disease Mother    Social History   Socioeconomic History   Marital status: Married    Spouse name: Not on file   Number of children: 2   Years of education: Not on file   Highest education level: Not on file  Occupational History   Not on file  Tobacco Use   Smoking status: Never   Smokeless tobacco: Never  Substance and Sexual Activity   Alcohol use: Not on file   Drug use: Never   Sexual activity: Not on file  Other Topics Concern   Not on file  Social History Narrative   Not on file   Social Determinants of Health   Financial Resource Strain: Low Risk  (04/17/2022)   Overall Financial Resource Strain (CARDIA)    Difficulty of Paying Living Expenses: Not hard  at all  Food Insecurity: No Food Insecurity (04/17/2022)   Hunger Vital Sign    Worried About Running Out of Food in the Last Year: Never true    Ran Out of Food in the Last Year: Never true  Transportation Needs: No Transportation Needs (04/17/2022)   PRAPARE - Hydrologist (Medical): No    Lack of Transportation (Non-Medical): No  Physical Activity: Not on file  Stress: No Stress Concern Present (04/17/2022)   [place]    Feeling of Stress : Not at all  Social Connections: Unknown (04/17/2022)   Social Connection and Isolation Panel [NHANES]    Frequency of Communication with Friends and Family: Not on file    Frequency of Social Gatherings with Friends and Family: Not on file    Attends Religious  Services: Not on Advertising copywriter or Organizations: Not on file    Attends Archivist Meetings: Not on file    Marital Status: Married    Tobacco Counseling Counseling given: Not Answered   Clinical Intake:  Pre-visit preparation completed: Yes        Diabetes: No  How often do you need to have someone help you when you read instructions, pamphlets, or other written materials from your doctor or pharmacy?: 1 - Never Interpreter Needed?: No    Activities of Daily Living     No data to display          Patient Care Team: Einar Pheasant, MD as PCP - General (Internal Medicine)  Indicate any recent Medical Services you may have received from other than [facility] providers in the past year (date may be approximate).     Assessment:   This is a routine wellness examination for Tonya Greer.  Virtual Visit via Telephone Note  I connected with  Tonya Greer on 04/17/22 at 11:00 AM EDT by telephone and verified that I am speaking with the correct person using two identifiers.  Persons participating in the virtual visit: patient/Nurse Health Advisor   I discussed the limitations of performing an evaluation and management service by telehealth. We continued and completed visit with audio only. Some vital signs may be absent or patient reported.   Hearing/Vision screen Hearing Screening - Comments:: Patient is able to hear conversational tones without difficulty. No issues reported.  Vision Screening - Comments:: Followed by [facility] corrective lenses  They have seen their ophthalmologist in the last 12 months.   Dietary issues and exercise activities discussed:   Healthy diet Good water intake   Goals Addressed             This Visit's Progress    Follow up with Primary Care Provider       As needed.       Depression Screen    04/17/2022   12:27 PM 09/22/2021    9:04 AM 05/20/2021    8:24 AM 04/07/2021   12:21 PM 03/08/2021     2:31 PM 01/12/2021   10:46 AM  PHQ 2/9 Scores  PHQ - 2 Score 0 0 0 0 0 0  PHQ- 9 Score    0 0     Fall Risk    09/22/2021    9:04 AM 05/20/2021    8:24 AM 04/07/2021    4:33 PM  [place] in the past year? 0 0 0  Number falls in past yr:  0 0 0  Injury with Fall? 0 0 0  Risk for fall due to : No Fall Risks    Follow up Falls evaluation completed Falls evaluation completed Falls evaluation completed    [place]: Home free of loose throw rugs in walkways, pet beds, electrical cords, etc? Yes  Adequate lighting in your home to reduce risk of falls? Yes   ASSISTIVE DEVICES UTILIZED TO PREVENT FALLS: Life alert? No  Use of a cane, walker or w/c? No   TIMED UP AND GO: Was the test performed? No .   Cognitive Function:  Patient is alert and oriented x3.       Immunizations Immunization History  Administered Date(s) Administered   Fluad Quad(high Dose 65+) 07/30/2020   Influenza Split 07/29/2014, 08/17/2015   Influenza, High Dose Seasonal PF 07/20/2021   Influenza, Quadrivalent, Recombinant, Inj, Pf 07/04/2019   Influenza, Seasonal, Injecte, Preservative Fre 07/16/2017, 06/18/2018   Moderna Sars-Covid-2 Vaccination 11/11/2019, 12/10/2019, 05/31/2020   Pfizer Covid-19 Vaccine Bivalent Booster 62yr & up 07/28/2021   Pneumococcal Conjugate-13 11/19/2014   Pneumococcal Polysaccharide-23 12/25/2018   Tdap 05/21/2019   Zoster Recombinat (Shingrix) 06/18/2018, 08/18/2018   Screening Tests Health Maintenance  Topic Date Due   Hepatitis C Screening  Never done   DEXA SCAN  Never done   FOOT EXAM  01/12/2022   INFLUENZA VACCINE  05/16/2022   MAMMOGRAM  07/09/2022   HEMOGLOBIN A1C  08/09/2022   OPHTHALMOLOGY EXAM  12/29/2022   Pneumonia Vaccine 67 Years old (3 - PPSV23 if available, else PCV20) 12/25/2023   COLONOSCOPY (Pts 45-495yrInsurance coverage will need to be confirmed)  10/26/2028   TETANUS/TDAP  05/20/2029   COVID-19  Vaccine  Completed   Zoster Vaccines- Shingrix  Completed   HPV VACCINES  Aged Out   Health Maintenance Health Maintenance Due  Topic Date Due   Hepatitis C Screening  Never done   DEXA SCAN  Never done   FOOT EXAM  01/12/2022   Lung Cancer Screening: (Low Dose CT Chest recommended if Age 10635-80ears, 30 pack-year currently smoking OR have quit w/in 15years.) does not qualify.   Hepatitis C Screening: deferred per patient.   Vision Screening: Recommended annual ophthalmology exams for early detection of glaucoma and other disorders of the eye.  Dental Screening: Recommended annual dental exams for proper oral hygiene  Community Resource Referral / Chronic Care Management: CRR required this visit?  No   CCM required this visit?  No      Plan:   Keep all routine maintenance appointments.   I have personally reviewed and noted the following in the patient's chart:   Medical and social history Use of alcohol, tobacco or illicit drugs  Current medications and supplements including opioid prescriptions.  Functional ability and status Nutritional status Physical activity Advanced directives List of other physicians Hospitalizations, surgeries, and ER visits in previous 12 months Vitals Screenings to include cognitive, depression, and falls Referrals and appointments  In addition, I have reviewed and discussed with patient certain preventive protocols, quality metrics, and best practice recommendations. A written personalized care plan for preventive services as well as general preventive health recommendations were provided to patient.     OBVarney BilesLPN   7/01/19/2702

## 2022-04-17 NOTE — Patient Instructions (Addendum)
  Tonya Greer , Thank you for taking time to come for your Medicare Wellness Visit. I appreciate your ongoing commitment to your health goals. Please review the following plan we discussed and let me know if I can assist you in the future.   These are the goals we discussed:  Goals      Follow up with Primary Care Provider     As needed.        This is a list of the screening recommended for you and due dates:  Health Maintenance  Topic Date Due   Hepatitis C Screening: USPSTF Recommendation to screen - Ages 75-79 yo.  Never done   DEXA scan (bone density measurement)  Never done   Complete foot exam   01/12/2022   Flu Shot  05/16/2022   Mammogram  07/09/2022   Hemoglobin A1C  08/09/2022   Eye exam for diabetics  12/29/2022   Pneumonia Vaccine (3 - PPSV23 if available, else PCV20) 12/25/2023   Colon Cancer Screening  10/26/2028   Tetanus Vaccine  05/20/2029   COVID-19 Vaccine  Completed   Zoster (Shingles) Vaccine  Completed   HPV Vaccine  Aged Out

## 2022-04-28 ENCOUNTER — Telehealth: Payer: Self-pay | Admitting: Internal Medicine

## 2022-04-28 NOTE — Telephone Encounter (Signed)
[employer] Prior Auth needs to confirm/clarify quantity for sertraline (ZOLOFT) for the 30 day supply.  Pls call [phone] option 5.

## 2022-04-28 NOTE — Telephone Encounter (Signed)
S/w [facility] at [facility] - qty confirmed to be a 30/30 day supply. Questioned due to pt being on '100mg'$  and '50mg'$  of sertraline.  Approval granted. Will be filled through [place].

## 2022-04-30 ENCOUNTER — Other Ambulatory Visit: Payer: Self-pay

## 2022-05-01 ENCOUNTER — Other Ambulatory Visit: Payer: Self-pay

## 2022-05-03 ENCOUNTER — Other Ambulatory Visit: Payer: Self-pay | Admitting: Internal Medicine

## 2022-05-28 ENCOUNTER — Other Ambulatory Visit: Payer: Self-pay

## 2022-05-29 ENCOUNTER — Other Ambulatory Visit: Payer: Self-pay

## 2022-05-31 ENCOUNTER — Other Ambulatory Visit: Payer: Self-pay | Admitting: Internal Medicine

## 2022-06-06 ENCOUNTER — Ambulatory Visit: Payer: Medicare Other | Admitting: Internal Medicine

## 2022-06-08 DIAGNOSIS — D2261 Melanocytic nevi of right upper limb, including shoulder: Secondary | ICD-10-CM | POA: Diagnosis not present

## 2022-06-08 DIAGNOSIS — D225 Melanocytic nevi of trunk: Secondary | ICD-10-CM | POA: Diagnosis not present

## 2022-06-08 DIAGNOSIS — D2262 Melanocytic nevi of left upper limb, including shoulder: Secondary | ICD-10-CM | POA: Diagnosis not present

## 2022-06-08 DIAGNOSIS — D2272 Melanocytic nevi of left lower limb, including hip: Secondary | ICD-10-CM | POA: Diagnosis not present

## 2022-06-13 ENCOUNTER — Encounter: Payer: Self-pay | Admitting: Internal Medicine

## 2022-06-13 MED ORDER — FLUTICASONE PROPIONATE HFA 110 MCG/ACT IN AERO: 2.0000 | INHALATION_SPRAY | Freq: Two times a day (BID) | RESPIRATORY_TRACT | 2 refills | Status: DC

## 2022-06-13 NOTE — Telephone Encounter (Signed)
I do not mind refilling the medication, but please call her and confirm doing ok.  It appears she has not been on this recently.  Is she having issues with breathing, etc?

## 2022-06-13 NOTE — Telephone Encounter (Signed)
Rx sent in for flovent.

## 2022-06-13 NOTE — Telephone Encounter (Signed)
Pt states that she hasn't had it filled since moving to [place] b/c she only needs it around this time. Every year around September she gets a slight cough & Flovent help her get through the change of season.  Pt uses: [facility]

## 2022-06-25 ENCOUNTER — Other Ambulatory Visit: Payer: Self-pay

## 2022-06-26 ENCOUNTER — Other Ambulatory Visit: Payer: Self-pay

## 2022-06-30 ENCOUNTER — Encounter: Payer: Self-pay | Admitting: Internal Medicine

## 2022-06-30 MED ORDER — SERTRALINE HCL 100 MG PO TABS: ORAL_TABLET | ORAL | 0 refills | Status: DC

## 2022-06-30 NOTE — Telephone Encounter (Signed)
Lvm for pt to return call to sch CPE w/PCP. Sent mychart.

## 2022-06-30 NOTE — Telephone Encounter (Signed)
Rx sent in for sertraline.  Needs to be scheduled for a physical.  Please schedule.

## 2022-07-17 DIAGNOSIS — Z23 Encounter for immunization: Secondary | ICD-10-CM | POA: Diagnosis not present

## 2022-08-02 ENCOUNTER — Other Ambulatory Visit: Payer: Self-pay | Admitting: Internal Medicine

## 2022-08-14 DIAGNOSIS — R92323 Mammographic fibroglandular density, bilateral breasts: Secondary | ICD-10-CM | POA: Diagnosis not present

## 2022-08-14 DIAGNOSIS — Z7982 Long term (current) use of aspirin: Secondary | ICD-10-CM | POA: Diagnosis not present

## 2022-08-14 DIAGNOSIS — Z7951 Long term (current) use of inhaled steroids: Secondary | ICD-10-CM | POA: Diagnosis not present

## 2022-08-14 DIAGNOSIS — Z79890 Hormone replacement therapy (postmenopausal): Secondary | ICD-10-CM | POA: Diagnosis not present

## 2022-08-14 DIAGNOSIS — C50912 Malignant neoplasm of unspecified site of left female breast: Secondary | ICD-10-CM | POA: Diagnosis not present

## 2022-08-14 DIAGNOSIS — Z853 Personal history of malignant neoplasm of breast: Secondary | ICD-10-CM | POA: Diagnosis not present

## 2022-08-14 DIAGNOSIS — Z7984 Long term (current) use of oral hypoglycemic drugs: Secondary | ICD-10-CM | POA: Diagnosis not present

## 2022-08-14 DIAGNOSIS — Z79899 Other long term (current) drug therapy: Secondary | ICD-10-CM | POA: Diagnosis not present

## 2022-08-14 DIAGNOSIS — Z170 Estrogen receptor positive status [ER+]: Secondary | ICD-10-CM | POA: Diagnosis not present

## 2022-08-14 DIAGNOSIS — D472 Monoclonal gammopathy: Secondary | ICD-10-CM | POA: Diagnosis not present

## 2022-08-25 ENCOUNTER — Telehealth: Payer: Self-pay

## 2022-08-25 NOTE — Telephone Encounter (Signed)
Vivienne Brooker Key: [id number] help? Call us at [phone] Outcome Approvedtoday Effective from 08/25/2022 through 08/26/2023. Drug Mounjaro 2.'5MG'$ /0.5ML pen-injectors Form [employer] [place] Medicare Part D Electronic Request Form (CB)

## 2022-08-25 NOTE — Telephone Encounter (Signed)
Tonya Greer Key: [id number] help? Call us at [phone] Status Sent to [place] 2.'5MG'$ /0.5ML pen-injectors Form [employer] [place] Medicare Part D Electronic Request Form (CB)

## 2022-08-29 ENCOUNTER — Encounter: Payer: Self-pay | Admitting: Internal Medicine

## 2022-08-29 ENCOUNTER — Other Ambulatory Visit: Payer: Self-pay

## 2022-08-29 ENCOUNTER — Ambulatory Visit (INDEPENDENT_AMBULATORY_CARE_PROVIDER_SITE_OTHER): Payer: Medicare Other | Admitting: Internal Medicine

## 2022-08-29 VITALS — BP 120/64 | HR 69 | Temp 98.0°F | Resp 15 | Ht 61.0 in | Wt 149.0 lb

## 2022-08-29 DIAGNOSIS — D649 Anemia, unspecified: Secondary | ICD-10-CM

## 2022-08-29 DIAGNOSIS — E1169 Type 2 diabetes mellitus with other specified complication: Secondary | ICD-10-CM | POA: Diagnosis not present

## 2022-08-29 DIAGNOSIS — E039 Hypothyroidism, unspecified: Secondary | ICD-10-CM | POA: Diagnosis not present

## 2022-08-29 DIAGNOSIS — F320 Major depressive disorder, single episode, mild: Secondary | ICD-10-CM

## 2022-08-29 DIAGNOSIS — E785 Hyperlipidemia, unspecified: Secondary | ICD-10-CM | POA: Diagnosis not present

## 2022-08-29 DIAGNOSIS — G4733 Obstructive sleep apnea (adult) (pediatric): Secondary | ICD-10-CM

## 2022-08-29 DIAGNOSIS — I10 Essential (primary) hypertension: Secondary | ICD-10-CM

## 2022-08-29 DIAGNOSIS — I700 Atherosclerosis of aorta: Secondary | ICD-10-CM

## 2022-08-29 DIAGNOSIS — D472 Monoclonal gammopathy: Secondary | ICD-10-CM

## 2022-08-29 DIAGNOSIS — F419 Anxiety disorder, unspecified: Secondary | ICD-10-CM

## 2022-08-29 LAB — TSH: TSH: 1.04 u[IU]/mL (ref 0.35–5.50)

## 2022-08-29 LAB — LIPID PANEL
Cholesterol: 154 mg/dL (ref 0–200)
HDL: 65.8 mg/dL (ref >39.00)
LDL Cholesterol: 72 mg/dL (ref 0–99)
NonHDL: 87.87
Total CHOL/HDL Ratio: 2
Triglycerides: 80 mg/dL (ref 0.0–149.0)
VLDL: 16 mg/dL (ref 0.0–40.0)

## 2022-08-29 LAB — MICROALBUMIN / CREATININE URINE RATIO
Creatinine,U: 135.2 mg/dL
Microalb Creat Ratio: 0.5 mg/g (ref 0.0–30.0)
Microalb, Ur: <0.7 mg/dL (ref 0.0–1.9)

## 2022-08-29 LAB — BASIC METABOLIC PANEL
BUN: 26 mg/dL — ABNORMAL HIGH (ref 6–23)
CO2: 30 mEq/L (ref 19–32)
Calcium: 9.3 mg/dL (ref 8.4–10.5)
Chloride: 102 mEq/L (ref 96–112)
Creatinine, Ser: 1 mg/dL (ref 0.40–1.20)
GFR: 58.32 mL/min — ABNORMAL LOW (ref >60.00)
Glucose, Bld: 95 mg/dL (ref 70–99)
Potassium: 4.3 mEq/L (ref 3.5–5.1)
Sodium: 138 mEq/L (ref 135–145)

## 2022-08-29 LAB — HEMOGLOBIN A1C: Hgb A1c MFr Bld: 5.8 % (ref 4.6–6.5)

## 2022-08-29 MED ORDER — TIRZEPATIDE 10 MG/0.5ML ~~LOC~~ SOAJ
10.0000 mg | SUBCUTANEOUS | 0 refills | Status: DC
  Filled 2022-08-29: qty 6, 84d supply, fill #0

## 2022-08-29 MED ORDER — TIRZEPATIDE 7.5 MG/0.5ML ~~LOC~~ SOAJ
7.5000 mg | SUBCUTANEOUS | 0 refills | Status: DC
  Filled 2022-08-29 – 2022-10-17 (×2): qty 2, 28d supply, fill #0
  Filled 2022-10-27: qty 6, 84d supply, fill #0

## 2022-08-29 NOTE — Progress Notes (Signed)
Patient ID: Tonya Greer, female   DOB: Oct 16, 1956, 66 y.o.   MRN: [id number]   Subjective:    Patient ID: Tonya Greer, female    DOB: [birth year], 66 y.o.   MRN: [id number]   Patient here for  Chief Complaint  Patient presents with   Follow-up   Hypertension   Diabetes   .   HPI Here to follow up regarding diabetes, cholesterol, history of breast cancer and weight loss.  Mammogram 08/14/22 - Birads II.  Evaluated by surgery 08/14/22 - stable. Recommended f/u in one year.  Saw oncology 08/14/22 - f/u IgG Kappa MGUS.  Recommended f/u in 6 months.  Continue cpap. Hgb improved with improved eating.  No chest pain or sob reported.  No abdominal pain or bowel issues reported.  Handling stress.  On zoloft 277m q day.  Feels doing better on this dose.     Past Medical History:  Diagnosis Date   Depression    Diabetes mellitus without complication (HCC)    History of carpal tunnel syndrome    Hyperlipidemia    Hypertension    Hypothyroidism    OSA (obstructive sleep apnea)    Past Surgical History:  Procedure Laterality Date   ABDOMINAL HYSTERECTOMY     ovaries left in place   brreast biopsy     FOOT SURGERY     TONSILLECTOMY AND ADENOIDECTOMY     Family History  Problem Relation Age of Onset   Heart disease Mother    Social History   Socioeconomic History   Marital status: Married    Spouse name: Not on file   Number of children: 2   Years of education: Not on file   Highest education level: Not on file  Occupational History   Not on file  Tobacco Use   Smoking status: Never   Smokeless tobacco: Never  Substance and Sexual Activity   Alcohol use: Not on file   Drug use: Never   Sexual activity: Not on file  Other Topics Concern   Not on file  Social History Narrative   Not on file   Social Determinants of Health   Financial Resource Strain: Low Risk  (04/17/2022)   Overall Financial Resource Strain (CARDIA)    Difficulty of Paying Living Expenses: Not hard  at all  Food Insecurity: No Food Insecurity (04/17/2022)   Hunger Vital Sign    Worried About Running Out of Food in the Last Year: Never true    Ran Out of Food in the Last Year: Never true  Transportation Needs: No Transportation Needs (04/17/2022)   PRAPARE - THydrologist(Medical): No    Lack of Transportation (Non-Medical): No  Physical Activity: Not on file  Stress: No Stress Concern Present (04/17/2022)   [place]   Feeling of Stress : Not at all  Social Connections: Unknown (04/17/2022)   Social Connection and Isolation Panel [NHANES]    Frequency of Communication with Friends and Family: Not on file    Frequency of Social Gatherings with Friends and Family: Not on file    Attends Religious Services: Not on file    Active Member of Clubs or Organizations: Not on file    Attends CArchivistMeetings: Not on file    Marital Status: Married     Review of Systems  Constitutional:  Negative for appetite change and unexpected weight change.  HENT:  Negative for congestion  and sinus pressure.   Respiratory:  Negative for cough, chest tightness and shortness of breath.   Cardiovascular:  Negative for chest pain, palpitations and leg swelling.  Gastrointestinal:  Negative for abdominal pain, diarrhea, nausea and vomiting.  Genitourinary:  Negative for difficulty urinating and dysuria.  Musculoskeletal:  Negative for joint swelling and myalgias.  Skin:  Negative for color change and rash.  Neurological:  Negative for dizziness and headaches.  Psychiatric/Behavioral:  Negative for agitation and dysphoric mood.        Objective:     BP 120/64 (BP Location: Left Arm, Patient Position: Sitting, Cuff Size: Small)   Pulse 69   Temp 98 F (36.7 C) (Temporal)   Resp 15   Ht _0  (1.549 m)   Wt 149 lb (67.6 kg)   SpO2 99%   BMI 28.15 kg/m  Wt Readings from Last 3 Encounters:   08/29/22 149 lb (67.6 kg)  04/17/22 150 lb (68 kg)  03/06/22 150 lb 6.4 oz (68.2 kg)    Physical Exam Vitals reviewed.  Constitutional:      General: She is not in acute distress.    Appearance: Normal appearance.  HENT:     Head: Normocephalic and atraumatic.     Right Ear: External ear normal.     Left Ear: External ear normal.  Eyes:     General: No scleral icterus.       Right eye: No discharge.        Left eye: No discharge.     Conjunctiva/sclera: Conjunctivae normal.  Neck:     Thyroid: No thyromegaly.  Cardiovascular:     Rate and Rhythm: Normal rate and regular rhythm.  Pulmonary:     Effort: No respiratory distress.     Breath sounds: Normal breath sounds. No wheezing.  Abdominal:     General: Bowel sounds are normal.     Palpations: Abdomen is soft.     Tenderness: There is no abdominal tenderness.  Musculoskeletal:        General: No swelling or tenderness.     Cervical back: Neck supple. No tenderness.  Lymphadenopathy:     Cervical: No cervical adenopathy.  Skin:    Findings: No erythema or rash.  Neurological:     Mental Status: She is alert.  Psychiatric:        Mood and Affect: Mood normal.        Behavior: Behavior normal.      Outpatient Encounter Medications as of 08/29/2022  Medication Sig   ALPHA LIPOIC ACID PO Take 600 mg by mouth daily.   aspirin 81 MG chewable tablet Chew 1 tablet by mouth daily.   CALCIUM CARB-CHOLECALCIFEROL PO Take by mouth.   fluticasone (FLOVENT HFA) 110 MCG/ACT inhaler Inhale 2 puffs into the lungs in the morning and at bedtime.   levothyroxine (SYNTHROID) 100 MCG tablet TAKE 1 TABLET BY MOUTH EVERY DAY   lisinopril (ZESTRIL) 20 MG tablet Take 1 tablet (20 mg total) by mouth daily.   magnesium oxide (MAG-OX) 400 (240 Mg) MG tablet TAKE 1 TABLET BY MOUTH EVERY DAY   metFORMIN (GLUCOPHAGE) 500 MG tablet TAKE 1 TABLET BY MOUTH EVERY DAY   rosuvastatin (CRESTOR) 20 MG tablet TAKE 1 TABLET BY MOUTH EVERY DAY    sertraline (ZOLOFT) 100 MG tablet Take two tablets daily. (Patient taking differently: 200 mg daily. Take two tablets daily.)   vitamin B-12 (CYANOCOBALAMIN) 1000 MCG tablet Take 5,000 mcg by mouth daily.   [DISCONTINUED]  tirzepatide (MOUNJARO) 10 MG/0.5ML Pen Inject 10 mg into the skin once a week.   [DISCONTINUED] tirzepatide (MOUNJARO) 7.5 MG/0.5ML Pen Inject 7.5 mg into the skin once a week. (Patient taking differently: Inject 10 mg into the skin once a week.)   [DISCONTINUED] sertraline (ZOLOFT) 50 MG tablet Take one tablet 50 mg daily along with the 100 mg tablet to equal 150 mg total.   No facility-administered encounter medications on file as of 08/29/2022.     Lab Results  Component Value Date   WBC 5.1 04/03/2022   HGB 11.8 (L) 04/03/2022   HCT 35.3 (L) 04/03/2022   PLT 155.0 04/03/2022   GLUCOSE 95 08/29/2022   CHOL 154 08/29/2022   TRIG 80.0 08/29/2022   HDL 65.80 08/29/2022   LDLCALC 72 08/29/2022   ALT 19 02/07/2022   AST 19 02/07/2022   NA 138 08/29/2022   K 4.3 08/29/2022   CL 102 08/29/2022   CREATININE 1.00 08/29/2022   BUN 26 (H) 08/29/2022   CO2 30 08/29/2022   TSH 1.04 08/29/2022   HGBA1C 5.8 08/29/2022   MICROALBUR <0.7 08/29/2022       Assessment & Plan:   Problem List Items Addressed This Visit     Anemia    Found on recent lab check.  Trying to adjust diet.  Hgb improved recent check - improved with improved eating/appetite.  Follow.       Anxiety    On zoloft 241m q day.  Appears to be stable.  Follow.         Aortic atherosclerosis (HCC)    Continue crestor.       DM type 2 with diabetic dyslipidemia (HCC) - Primary    Low carb diet and exercise.  Continue metformin and mounjaro.  Has lost weight.  Follow met b and a1c.  Lab Results  Component Value Date   HGBA1C 5.8 08/29/2022       Relevant Orders   Basic metabolic panel (Completed)   Hemoglobin A1c (Completed)   Lipid panel (Completed)   Microalbumin / creatinine urine ratio  (Completed)   Hypertension    Blood pressure doing well on lisinopril 219mq day.  Follow pressures.  No changes in medication.  Follow metabolic panel.       Hypothyroid    On thyroid replacement.  Follow tsh.       Relevant Orders   TSH (Completed)   Major depressive disorder, single episode, mild ([place]CDam[place]   On zoloft.  Appears to be relatively stable. Follow.       MGUS (monoclonal gammopathy of unknown significance)    Saw oncology 08/14/22 - f/u IgG Kappa MGUS.  Recommended f/u in 6 months.  Discussed with her today.       OSA (obstructive sleep apnea)    Continue cpap.         ChEinar PheasantMD

## 2022-09-08 ENCOUNTER — Other Ambulatory Visit: Payer: Self-pay

## 2022-09-10 ENCOUNTER — Encounter: Payer: Self-pay | Admitting: Internal Medicine

## 2022-09-10 DIAGNOSIS — D472 Monoclonal gammopathy: Secondary | ICD-10-CM | POA: Insufficient documentation

## 2022-09-10 NOTE — Assessment & Plan Note (Signed)
Blood pressure doing well on lisinopril 20mg q day.  Follow pressures.  No changes in medication.  Follow metabolic panel.  

## 2022-09-10 NOTE — Assessment & Plan Note (Signed)
Continue crestor 

## 2022-09-10 NOTE — Assessment & Plan Note (Signed)
Found on recent lab check.  Trying to adjust diet.  Hgb improved recent check - improved with improved eating/appetite.  Follow.

## 2022-09-10 NOTE — Assessment & Plan Note (Signed)
On thyroid replacement.  Follow tsh.  

## 2022-09-10 NOTE — Assessment & Plan Note (Signed)
Continue cpap.  

## 2022-09-10 NOTE — Assessment & Plan Note (Signed)
Saw oncology 08/14/22 - f/u IgG Kappa MGUS.  Recommended f/u in 6 months.  Discussed with her today.

## 2022-09-10 NOTE — Assessment & Plan Note (Signed)
Low carb diet and exercise.  Continue metformin and mounjaro.  Has lost weight.  Follow met b and a1c.  Lab Results  Component Value Date   HGBA1C 5.8 08/29/2022

## 2022-09-10 NOTE — Assessment & Plan Note (Signed)
On zoloft 200mg q day.  Appears to be stable.  Follow.    

## 2022-09-10 NOTE — Assessment & Plan Note (Signed)
On zoloft.  Appears to be relatively stable. Follow.  

## 2022-10-07 ENCOUNTER — Encounter: Payer: Self-pay | Admitting: Internal Medicine

## 2022-10-09 ENCOUNTER — Other Ambulatory Visit: Payer: Self-pay | Admitting: Family

## 2022-10-09 MED ORDER — SERTRALINE HCL 100 MG PO TABS: 200.0000 mg | ORAL_TABLET | Freq: Every day | ORAL | 0 refills | Status: DC

## 2022-10-17 ENCOUNTER — Encounter: Payer: Self-pay | Admitting: Internal Medicine

## 2022-10-17 ENCOUNTER — Other Ambulatory Visit: Payer: Self-pay

## 2022-10-18 ENCOUNTER — Other Ambulatory Visit: Payer: Self-pay

## 2022-10-23 ENCOUNTER — Other Ambulatory Visit: Payer: Self-pay

## 2022-10-23 ENCOUNTER — Other Ambulatory Visit (HOSPITAL_COMMUNITY): Payer: Self-pay

## 2022-10-23 DIAGNOSIS — E1169 Type 2 diabetes mellitus with other specified complication: Secondary | ICD-10-CM

## 2022-10-23 NOTE — Telephone Encounter (Signed)
[place].  Please notify pt of attached information and see if agreeable.

## 2022-10-23 NOTE — Telephone Encounter (Signed)
Pt advised re: pt assistance Agreeable [phone] sent Urgent - pt out of medication

## 2022-10-24 ENCOUNTER — Telehealth: Payer: Self-pay | Admitting: Pharmacist

## 2022-10-24 NOTE — Telephone Encounter (Signed)
Outreached patient via MyChart to schedule initial referral   Catie TJodi Mourning, PharmD, [place], [facility] [phone]

## 2022-10-27 ENCOUNTER — Other Ambulatory Visit: Payer: Self-pay

## 2022-11-02 ENCOUNTER — Other Ambulatory Visit (HOSPITAL_COMMUNITY): Payer: Self-pay

## 2022-11-10 ENCOUNTER — Other Ambulatory Visit: Payer: Self-pay

## 2022-11-10 ENCOUNTER — Encounter: Payer: Self-pay | Admitting: Internal Medicine

## 2022-11-10 DIAGNOSIS — E785 Hyperlipidemia, unspecified: Secondary | ICD-10-CM

## 2022-11-10 DIAGNOSIS — I10 Essential (primary) hypertension: Secondary | ICD-10-CM

## 2022-11-10 DIAGNOSIS — Z13220 Encounter for screening for lipoid disorders: Secondary | ICD-10-CM

## 2022-11-10 DIAGNOSIS — D649 Anemia, unspecified: Secondary | ICD-10-CM

## 2022-11-22 ENCOUNTER — Other Ambulatory Visit: Payer: Self-pay | Admitting: Internal Medicine

## 2022-11-29 ENCOUNTER — Other Ambulatory Visit: Payer: Self-pay

## 2022-11-29 ENCOUNTER — Ambulatory Visit (INDEPENDENT_AMBULATORY_CARE_PROVIDER_SITE_OTHER): Payer: Medicare Other | Admitting: Internal Medicine

## 2022-11-29 ENCOUNTER — Encounter: Payer: Self-pay | Admitting: Internal Medicine

## 2022-11-29 VITALS — BP 114/70 | HR 60 | Temp 98.0°F | Resp 16 | Ht 62.0 in | Wt 149.2 lb

## 2022-11-29 DIAGNOSIS — I10 Essential (primary) hypertension: Secondary | ICD-10-CM

## 2022-11-29 DIAGNOSIS — D472 Monoclonal gammopathy: Secondary | ICD-10-CM

## 2022-11-29 DIAGNOSIS — D649 Anemia, unspecified: Secondary | ICD-10-CM

## 2022-11-29 DIAGNOSIS — F320 Major depressive disorder, single episode, mild: Secondary | ICD-10-CM

## 2022-11-29 DIAGNOSIS — Z0000 Encounter for general adult medical examination without abnormal findings: Secondary | ICD-10-CM

## 2022-11-29 DIAGNOSIS — F419 Anxiety disorder, unspecified: Secondary | ICD-10-CM

## 2022-11-29 DIAGNOSIS — G479 Sleep disorder, unspecified: Secondary | ICD-10-CM

## 2022-11-29 DIAGNOSIS — E785 Hyperlipidemia, unspecified: Secondary | ICD-10-CM | POA: Diagnosis not present

## 2022-11-29 DIAGNOSIS — G4733 Obstructive sleep apnea (adult) (pediatric): Secondary | ICD-10-CM

## 2022-11-29 DIAGNOSIS — Z13220 Encounter for screening for lipoid disorders: Secondary | ICD-10-CM | POA: Diagnosis not present

## 2022-11-29 DIAGNOSIS — E039 Hypothyroidism, unspecified: Secondary | ICD-10-CM

## 2022-11-29 DIAGNOSIS — E1169 Type 2 diabetes mellitus with other specified complication: Secondary | ICD-10-CM

## 2022-11-29 DIAGNOSIS — I700 Atherosclerosis of aorta: Secondary | ICD-10-CM

## 2022-11-29 LAB — BASIC METABOLIC PANEL
BUN: 19 mg/dL (ref 6–23)
CO2: 30 mEq/L (ref 19–32)
Calcium: 9.2 mg/dL (ref 8.4–10.5)
Chloride: 107 mEq/L (ref 96–112)
Creatinine, Ser: 0.87 mg/dL (ref 0.40–1.20)
GFR: 68.8 mL/min (ref >60.00)
Glucose, Bld: 94 mg/dL (ref 70–99)
Potassium: 3.9 mEq/L (ref 3.5–5.1)
Sodium: 137 mEq/L (ref 135–145)

## 2022-11-29 LAB — CBC WITH DIFFERENTIAL/PLATELET
Basophils Absolute: 0.1 10*3/uL (ref 0.0–0.1)
Basophils Relative: 1.2 % (ref 0.0–3.0)
Eosinophils Absolute: 0.2 10*3/uL (ref 0.0–0.7)
Eosinophils Relative: 5 % (ref 0.0–5.0)
HCT: 36.8 % (ref 36.0–46.0)
Hemoglobin: 12.5 g/dL (ref 12.0–15.0)
Lymphocytes Relative: 25.9 % (ref 12.0–46.0)
Lymphs Abs: 1.2 10*3/uL (ref 0.7–4.0)
MCHC: 34 g/dL (ref 30.0–36.0)
MCV: 93.2 fl (ref 78.0–100.0)
Monocytes Absolute: 0.3 10*3/uL (ref 0.1–1.0)
Monocytes Relative: 7.1 % (ref 3.0–12.0)
Neutro Abs: 2.9 10*3/uL (ref 1.4–7.7)
Neutrophils Relative %: 60.8 % (ref 43.0–77.0)
Platelets: 163 10*3/uL (ref 150.0–400.0)
RBC: 3.95 Mil/uL (ref 3.87–5.11)
RDW: 14.1 % (ref 11.5–15.5)
WBC: 4.8 10*3/uL (ref 4.0–10.5)

## 2022-11-29 LAB — HEPATIC FUNCTION PANEL
ALT: 25 U/L (ref 0–35)
AST: 27 U/L (ref 0–37)
Albumin: 4.2 g/dL (ref 3.5–5.2)
Alkaline Phosphatase: 48 U/L (ref 39–117)
Bilirubin, Direct: 0.1 mg/dL (ref 0.0–0.3)
Total Bilirubin: 0.5 mg/dL (ref 0.2–1.2)
Total Protein: 6.5 g/dL (ref 6.0–8.3)

## 2022-11-29 LAB — LIPID PANEL
Cholesterol: 150 mg/dL (ref 0–200)
HDL: 63.7 mg/dL (ref >39.00)
LDL Cholesterol: 69 mg/dL (ref 0–99)
NonHDL: 86.44
Total CHOL/HDL Ratio: 2
Triglycerides: 87 mg/dL (ref 0.0–149.0)
VLDL: 17.4 mg/dL (ref 0.0–40.0)

## 2022-11-29 LAB — HEMOGLOBIN A1C: Hgb A1c MFr Bld: 6.1 % (ref 4.6–6.5)

## 2022-11-29 MED ORDER — HYDROXYZINE HCL 10 MG PO TABS: 10.0000 mg | ORAL_TABLET | Freq: Every evening | ORAL | 0 refills | Status: DC | PRN

## 2022-11-29 MED ORDER — TIRZEPATIDE 7.5 MG/0.5ML ~~LOC~~ SOAJ
7.5000 mg | SUBCUTANEOUS | 1 refills | Status: DC
  Filled 2022-11-29 – 2023-01-17 (×2): qty 2, 28d supply, fill #0
  Filled 2023-02-22: qty 2, 28d supply, fill #1
  Filled 2023-03-26: qty 2, 28d supply, fill #2
  Filled 2023-04-19: qty 2, 28d supply, fill #3
  Filled 2023-05-17: qty 2, 28d supply, fill #4
  Filled 2023-06-14: qty 2, 28d supply, fill #5

## 2022-11-29 MED ORDER — SERTRALINE HCL 100 MG PO TABS: 200.0000 mg | ORAL_TABLET | Freq: Every day | ORAL | 1 refills | Status: DC

## 2022-11-29 NOTE — Progress Notes (Signed)
Subjective:    Patient ID: Tonya Greer, female    DOB: [birth year], 67 y.o.   MRN: [id number]  Patient here for  Chief Complaint  Patient presents with   Annual Exam    HPI Here for a physical exam.  Reports she is doing relatively well.  Tries to stay active.  No chest pain or sob reported.   Has sleep apnea.  Uses CPAP.  Having problems sleeping.  Discussed.  Discussed treatment options.  Discussed trial of hydroxyzine.  No chest pain or sob reported.  No cough or congestion.  No abdominal pain or bowel change reported.  On mounjaro.  Has done well with this medication.  On zoloft. Doing better.     Past Medical History:  Diagnosis Date   Depression    Diabetes mellitus without complication (HCC)    History of carpal tunnel syndrome    Hyperlipidemia    Hypertension    Hypothyroidism    OSA (obstructive sleep apnea)    Past Surgical History:  Procedure Laterality Date   ABDOMINAL HYSTERECTOMY     ovaries left in place   brreast biopsy     FOOT SURGERY     TONSILLECTOMY AND ADENOIDECTOMY     Family History  Problem Relation Age of Onset   Heart disease Mother    Social History   Socioeconomic History   Marital status: Married    Spouse name: Not on file   Number of children: 2   Years of education: Not on file   Highest education level: Not on file  Occupational History   Not on file  Tobacco Use   Smoking status: Never   Smokeless tobacco: Never  Substance and Sexual Activity   Alcohol use: Not on file   Drug use: Never   Sexual activity: Not on file  Other Topics Concern   Not on file  Social History Narrative   Not on file   Social Determinants of Health   Financial Resource Strain: Low Risk  (04/17/2022)   Overall Financial Resource Strain (CARDIA)    Difficulty of Paying Living Expenses: Not hard at all  Food Insecurity: No Food Insecurity (04/17/2022)   Hunger Vital Sign    Worried About Running Out of Food in the Last Year: Never true    Ran  Out of Food in the Last Year: Never true  Transportation Needs: No Transportation Needs (04/17/2022)   PRAPARE - Hydrologist (Medical): No    Lack of Transportation (Non-Medical): No  Physical Activity: Not on file  Stress: No Stress Concern Present (04/17/2022)   [place]    Feeling of Stress : Not at all  Social Connections: Unknown (04/17/2022)   Social Connection and Isolation Panel [NHANES]    Frequency of Communication with Friends and Family: Not on file    Frequency of Social Gatherings with Friends and Family: Not on file    Attends Religious Services: Not on file    Active Member of Clubs or Organizations: Not on file    Attends Archivist Meetings: Not on file    Marital Status: Married     Review of Systems  Constitutional:  Negative for appetite change and unexpected weight change.  HENT:  Negative for congestion, sinus pressure and sore throat.   Eyes:  Negative for pain and visual disturbance.  Respiratory:  Negative for cough, chest tightness and shortness of breath.  Cardiovascular:  Negative for chest pain, palpitations and leg swelling.  Gastrointestinal:  Negative for abdominal pain, diarrhea, nausea and vomiting.  Genitourinary:  Negative for difficulty urinating and dysuria.  Musculoskeletal:  Negative for joint swelling and myalgias.  Skin:  Negative for color change and rash.  Neurological:  Negative for dizziness and headaches.  Hematological:  Negative for adenopathy. Does not bruise/bleed easily.  Psychiatric/Behavioral:  Positive for sleep disturbance. Negative for agitation and dysphoric mood.        Objective:     BP 114/70   Pulse 60   Temp 98 F (36.7 C)   Resp 16   Ht 5' 2"$  (1.575 m)   Wt 149 lb 3.2 oz (67.7 kg)   SpO2 98%   BMI 27.29 kg/m  Wt Readings from Last 3 Encounters:  11/29/22 149 lb 3.2 oz (67.7 kg)  08/29/22 149 lb (67.6  kg)  04/17/22 150 lb (68 kg)    Physical Exam Vitals reviewed.  Constitutional:      General: She is not in acute distress.    Appearance: Normal appearance. She is well-developed.  HENT:     Head: Normocephalic and atraumatic.     Right Ear: External ear normal.     Left Ear: External ear normal.  Eyes:     General: No scleral icterus.       Right eye: No discharge.        Left eye: No discharge.     Conjunctiva/sclera: Conjunctivae normal.  Neck:     Thyroid: No thyromegaly.  Cardiovascular:     Rate and Rhythm: Normal rate and regular rhythm.  Pulmonary:     Effort: No tachypnea, accessory muscle usage or respiratory distress.     Breath sounds: Normal breath sounds. No decreased breath sounds or wheezing.  Chest:  Breasts:    Right: No inverted nipple, mass, nipple discharge or tenderness (no axillary adenopathy).     Left: No inverted nipple, mass, nipple discharge or tenderness (no axilarry adenopathy).  Abdominal:     General: Bowel sounds are normal.     Palpations: Abdomen is soft.     Tenderness: There is no abdominal tenderness.  Musculoskeletal:        General: No swelling or tenderness.     Cervical back: Neck supple.  Lymphadenopathy:     Cervical: No cervical adenopathy.  Skin:    Findings: No erythema or rash.  Neurological:     Mental Status: She is alert and oriented to person, place, and time.  Psychiatric:        Mood and Affect: Mood normal.        Behavior: Behavior normal.      Outpatient Encounter Medications as of 11/29/2022  Medication Sig   Ascorbic Acid (VITAMIN C) 1000 MG tablet Take 1,000 mg by mouth daily.   hydrOXYzine (ATARAX) 10 MG tablet Take 1 tablet (10 mg total) by mouth at bedtime as needed.   Magnesium Hydroxide 600 MG CHEW Chew 600 mg by mouth daily.   Multiple Vitamins-Minerals (CENTRUM SILVER PO) Take 1 tablet by mouth daily.   aspirin 81 MG chewable tablet Chew 1 tablet by mouth daily.   fluticasone (FLOVENT HFA) 110  MCG/ACT inhaler Inhale 2 puffs into the lungs in the morning and at bedtime.   levothyroxine (SYNTHROID) 100 MCG tablet TAKE 1 TABLET BY MOUTH EVERY DAY   lisinopril (ZESTRIL) 20 MG tablet Take 1 tablet (20 mg total) by mouth daily.   metFORMIN (GLUCOPHAGE)  500 MG tablet TAKE 1 TABLET BY MOUTH EVERY DAY   rosuvastatin (CRESTOR) 20 MG tablet TAKE 1 TABLET BY MOUTH EVERY DAY   sertraline (ZOLOFT) 100 MG tablet Take 2 tablets (200 mg total) by mouth daily. Take two tablets daily.   tirzepatide (MOUNJARO) 7.5 MG/0.5ML Pen Inject 7.5 mg into the skin once a week.   vitamin B-12 (CYANOCOBALAMIN) 1000 MCG tablet Take 5,000 mcg by mouth daily.   [DISCONTINUED] ALPHA LIPOIC ACID PO Take 600 mg by mouth daily.   [DISCONTINUED] CALCIUM CARB-CHOLECALCIFEROL PO Take by mouth.   [DISCONTINUED] magnesium oxide (MAG-OX) 400 (240 Mg) MG tablet TAKE 1 TABLET BY MOUTH EVERY DAY   [DISCONTINUED] sertraline (ZOLOFT) 100 MG tablet Take 2 tablets (200 mg total) by mouth daily. Take two tablets daily.   [DISCONTINUED] tirzepatide (MOUNJARO) 7.5 MG/0.5ML Pen Inject 7.5 mg into the skin once a week.   No facility-administered encounter medications on file as of 11/29/2022.     Lab Results  Component Value Date   WBC 4.8 11/29/2022   HGB 12.5 11/29/2022   HCT 36.8 11/29/2022   PLT 163.0 11/29/2022   GLUCOSE 94 11/29/2022   CHOL 150 11/29/2022   TRIG 87.0 11/29/2022   HDL 63.70 11/29/2022   LDLCALC 69 11/29/2022   ALT 25 11/29/2022   AST 27 11/29/2022   NA 137 11/29/2022   K 3.9 11/29/2022   CL 107 11/29/2022   CREATININE 0.87 11/29/2022   BUN 19 11/29/2022   CO2 30 11/29/2022   TSH 1.04 08/29/2022   HGBA1C 6.1 11/29/2022   MICROALBUR <0.7 08/29/2022    No results found.     Assessment & Plan:  Routine general medical examination at a health care facility  Health care maintenance Assessment & Plan: Physical today 11/29/22.  PAP of vaginal cuff performed (05/20/21) - negative with negative HPV.  Mammogram being followed through oncology - 08/14/22 - Birads II>  Need to obtain colonoscopy report.     Anemia, unspecified type Assessment & Plan: Follow cbc.   Orders: -     CBC with Differential/Platelet  DM type 2 with diabetic dyslipidemia ([place]) Assessment & Plan: Low carb diet and exercise.  Continue metformin and mounjaro.  Has lost weight.  Follow met b and a1c.  Lab Results  Component Value Date   HGBA1C 6.1 11/29/2022    Orders: -     Hemoglobin A1c -     Basic metabolic panel; Future -     Lipid panel; Future -     Hemoglobin A1c; Future -     Hepatic function panel; Future  Screening cholesterol level -     Hepatic function panel -     Lipid panel  Hypertension, unspecified type Assessment & Plan: Blood pressure doing well on lisinopril 14m q day.  Follow pressures.  No changes in medication.  Follow metabolic panel.   Orders: -     Basic metabolic panel  OSA (obstructive sleep apnea) Assessment & Plan: Continue cpap.    MGUS (monoclonal gammopathy of unknown significance) Assessment & Plan: Saw oncology 08/14/22 - f/u IgG Kappa MGUS.  Recommended f/u in 6 months.    Major depressive disorder, single episode, mild ([place] Assessment & Plan: On zoloft.  Appears to be relatively stable. Follow.    Hypothyroidism, unspecified type Assessment & Plan: On thyroid replacement.  Follow tsh.    Aortic atherosclerosis ([place] Assessment & Plan: Continue crestor.    Anxiety Assessment & Plan: On zoloft 2052mq day.  Appears to be stable.  Follow.      Sleep difficulties Assessment & Plan: Discussed.  Trial of hydroxyzine.  Follow.  Continue cpap.    Other orders -     Tirzepatide; Inject 7.5 mg into the skin once a week.  Dispense: 6 mL; Refill: 1 -     Sertraline HCl; Take 2 tablets (200 mg total) by mouth daily. Take two tablets daily.  Dispense: 180 tablet; Refill: 1 -     hydrOXYzine HCl; Take 1 tablet (10 mg total) by mouth at bedtime as  needed.  Dispense: 30 tablet; Refill: 0     Einar Pheasant, MD

## 2022-11-29 NOTE — Assessment & Plan Note (Signed)
Physical today 11/29/22.  PAP of vaginal cuff performed (05/20/21) - negative with negative HPV. Mammogram being followed through oncology - 08/14/22 - Birads II>  Need to obtain colonoscopy report.

## 2022-12-01 IMAGING — DX DG CERVICAL SPINE COMPLETE 4+V
6 series · 6 of 6 positions shown · non-contrast
Comparison: None.

CLINICAL DATA: Posterior neck pain, left worse than right. No
indication of neck trauma in the patient's record.

EXAM:
CERVICAL SPINE - COMPLETE 4+ VIEW

[cervical spine ap]
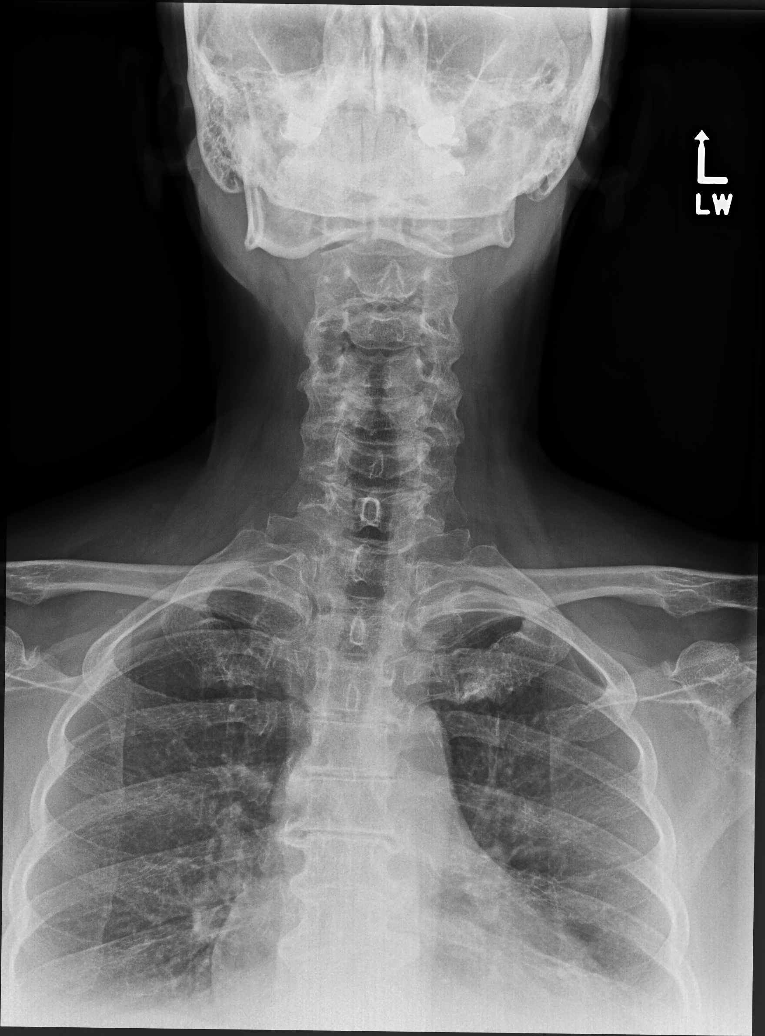

[cervical spine oblique (1 of 2)]
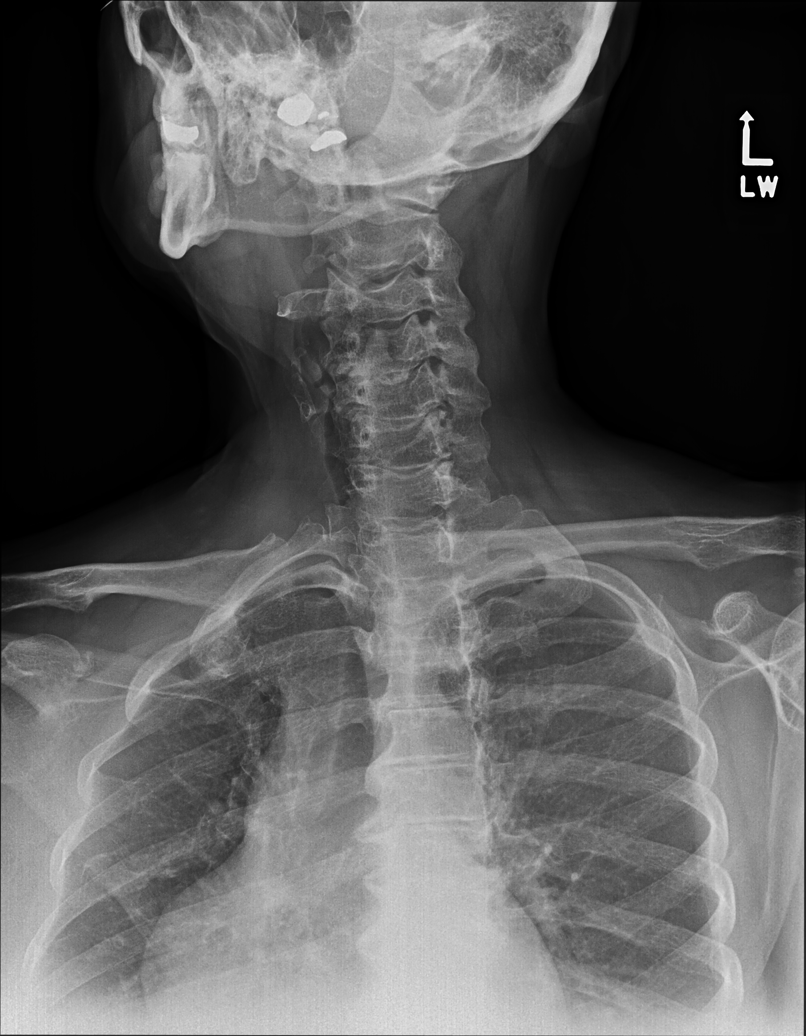

[cervical spine oblique (2 of 2)]
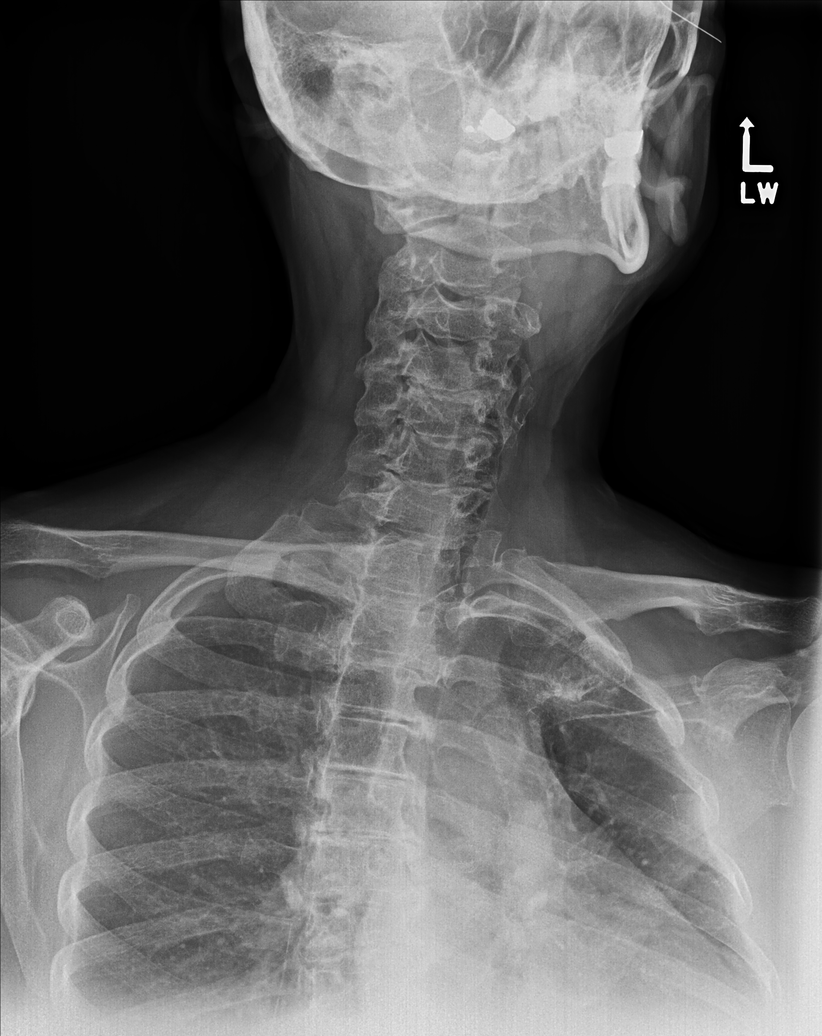

[cervical spine lat]
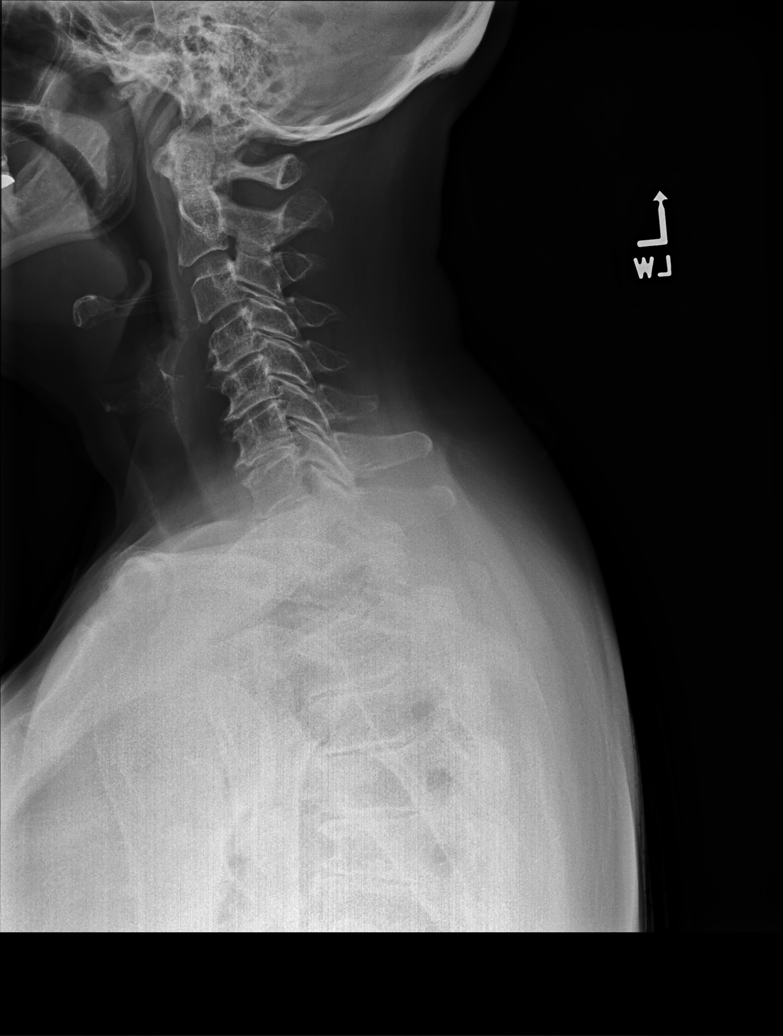

[swimmers lat]
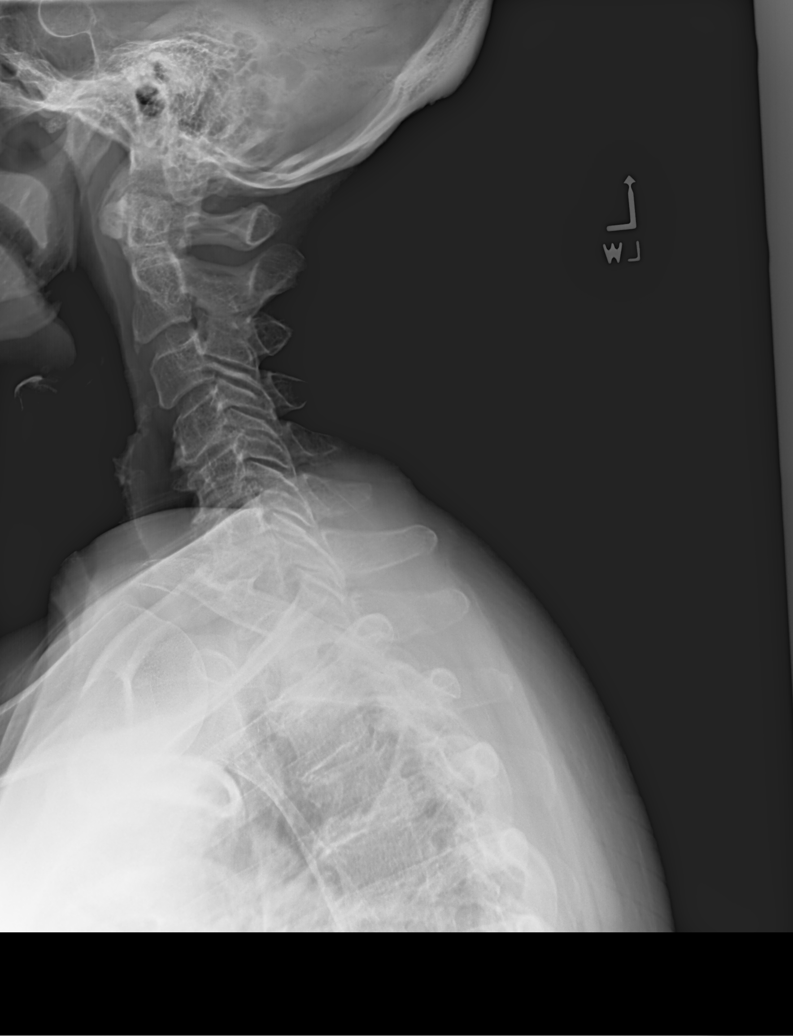

[cervical spine open mouth ap]
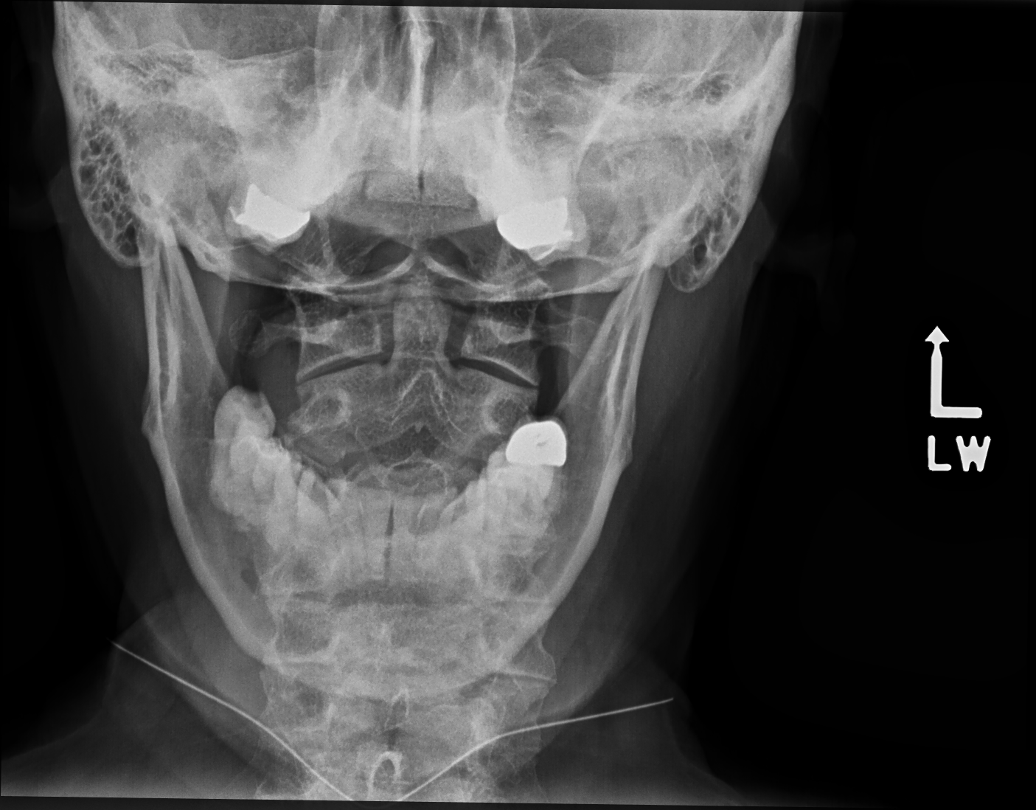

[6 of 6 positions shown; findings below may reference images not displayed]

FINDINGS: There is mild osteopenia without evidence of fractures. Straightened
lordosis with a 3 mm grade 1 retrolisthesis at both C4-5 and C5-6 ,
both most likely due to discogenic degenerative arthrosis.

No traumatic or further listhesis is seen. The C2-3 and C3-4 discs
are normal in height.

There is moderate disc space loss from C4-5 through C7-T1 with small
bidirectional endplate spurs at all 4 levels.

Uncinate joint and facet spurring is seen with foraminal stenosis
which is bilaterally mild-to-moderate at C3-4, moderate to severe on
the right and moderate on the left at C4-5, bilaterally moderate to
severe at C5-6 and C6-7.

No precervical soft tissue thickening is seen. Visualized upper lung
fields are clear. There is calcification in the transverse aortic
segment.
IMPRESSION: 1. Osteopenia and degenerative change without evidence of fractures.
2. Straightened lordosis and 3 mm grade 1 likely discogenic
retrolisthesis at C4-5 and C5-6.
3. Aortic atherosclerosis.

## 2022-12-04 ENCOUNTER — Encounter: Payer: Self-pay | Admitting: Internal Medicine

## 2022-12-04 ENCOUNTER — Telehealth: Payer: Self-pay | Admitting: Internal Medicine

## 2022-12-04 DIAGNOSIS — G479 Sleep disorder, unspecified: Secondary | ICD-10-CM | POA: Insufficient documentation

## 2022-12-04 NOTE — Assessment & Plan Note (Signed)
Blood pressure doing well on lisinopril 68m q day.  Follow pressures.  No changes in medication.  Follow metabolic panel.

## 2022-12-04 NOTE — Telephone Encounter (Signed)
Noted.  Please inform her not to take the hydroxyzine without wearing her cpap.

## 2022-12-04 NOTE — Assessment & Plan Note (Signed)
Discussed.  Trial of hydroxyzine.  Follow.  Continue cpap.

## 2022-12-04 NOTE — Assessment & Plan Note (Signed)
Follow cbc.  

## 2022-12-04 NOTE — Assessment & Plan Note (Signed)
On zoloft 279m q day.  Appears to be stable.  Follow.

## 2022-12-04 NOTE — Assessment & Plan Note (Signed)
On zoloft.  Appears to be relatively stable. Follow.

## 2022-12-04 NOTE — Assessment & Plan Note (Signed)
Continue cpap.  

## 2022-12-04 NOTE — Assessment & Plan Note (Signed)
Continue crestor 

## 2022-12-04 NOTE — Assessment & Plan Note (Signed)
Low carb diet and exercise.  Continue metformin and mounjaro.  Has lost weight.  Follow met b and a1c.  Lab Results  Component Value Date   HGBA1C 6.1 11/29/2022

## 2022-12-04 NOTE — Telephone Encounter (Signed)
Patient is wearing CPAP every night.

## 2022-12-04 NOTE — Telephone Encounter (Signed)
Noted. Patient is aware. Says that hydroxyzine isnt really helping but she is going to try for a little while longer and let us know how she does.

## 2022-12-04 NOTE — Assessment & Plan Note (Signed)
On thyroid replacement.  Follow tsh.  

## 2022-12-04 NOTE — Assessment & Plan Note (Signed)
Saw oncology 08/14/22 - f/u IgG Kappa MGUS.  Recommended f/u in 6 months.

## 2022-12-04 NOTE — Telephone Encounter (Signed)
Please call Tonya Greer.  I saw her recently and she was having some sleep issues.  Was started on hydroxyzine.  I need to also make sure she is wearing cpap nightly.  Please confirm.

## 2022-12-14 DIAGNOSIS — K080 Exfoliation of teeth due to systemic causes: Secondary | ICD-10-CM | POA: Diagnosis not present

## 2022-12-18 DIAGNOSIS — K080 Exfoliation of teeth due to systemic causes: Secondary | ICD-10-CM | POA: Diagnosis not present

## 2022-12-21 ENCOUNTER — Other Ambulatory Visit: Payer: Self-pay | Admitting: Internal Medicine

## 2022-12-21 NOTE — Telephone Encounter (Signed)
Rx ok'd for hydroxyzine #30 with one refill.

## 2022-12-22 ENCOUNTER — Encounter: Payer: Self-pay | Admitting: Internal Medicine

## 2022-12-22 NOTE — Telephone Encounter (Signed)
See other message regarding appt with me.

## 2022-12-22 NOTE — Telephone Encounter (Signed)
We started with a low dose of hydroxyzine.  May need to adjust dose or adjust medication.  Can schedule appt with me next Tuesday (02/25/23) - I think there is an open spot.  If not, let me know.

## 2023-01-01 ENCOUNTER — Encounter: Payer: Self-pay | Admitting: Internal Medicine

## 2023-01-01 ENCOUNTER — Ambulatory Visit (INDEPENDENT_AMBULATORY_CARE_PROVIDER_SITE_OTHER): Payer: Medicare Other | Admitting: Internal Medicine

## 2023-01-01 VITALS — BP 110/62 | HR 60 | Temp 98.0°F | Resp 16 | Ht 62.0 in | Wt 148.0 lb

## 2023-01-01 DIAGNOSIS — G4733 Obstructive sleep apnea (adult) (pediatric): Secondary | ICD-10-CM

## 2023-01-01 DIAGNOSIS — I10 Essential (primary) hypertension: Secondary | ICD-10-CM

## 2023-01-01 DIAGNOSIS — E785 Hyperlipidemia, unspecified: Secondary | ICD-10-CM

## 2023-01-01 DIAGNOSIS — F419 Anxiety disorder, unspecified: Secondary | ICD-10-CM | POA: Diagnosis not present

## 2023-01-01 DIAGNOSIS — E039 Hypothyroidism, unspecified: Secondary | ICD-10-CM

## 2023-01-01 DIAGNOSIS — G479 Sleep disorder, unspecified: Secondary | ICD-10-CM

## 2023-01-01 DIAGNOSIS — E1169 Type 2 diabetes mellitus with other specified complication: Secondary | ICD-10-CM | POA: Diagnosis not present

## 2023-01-01 DIAGNOSIS — F320 Major depressive disorder, single episode, mild: Secondary | ICD-10-CM

## 2023-01-01 NOTE — Progress Notes (Signed)
Subjective:    Patient ID: Tonya Greer, female    DOB: June 13, 1956, 67 y.o.   MRN: [id number]  Patient here for  Chief Complaint  Patient presents with   Medical Management of Chronic Issues    HPI Here for work in appt to discuss sleep. She is accompanied by her husband.  History obtained from both of them.  Has sleep apnea.  Uses cpap.  On zoloft - 200mg  .  Discussed timing of when taking zoloft.  Reports taking in am.  On further questioning, it appears there are days where she is taking later.  Reports hydroxyzine has the opposite affect on her.  Feels like that keeps her awake.  Appears her sleep has worsened over the last two months.  No chest pain or sob reported.  GI - stable.  Discussed treatment options.  She was questioning trazodone.     Past Medical History:  Diagnosis Date   Depression    Diabetes mellitus without complication (HCC)    History of carpal tunnel syndrome    Hyperlipidemia    Hypertension    Hypothyroidism    OSA (obstructive sleep apnea)    Past Surgical History:  Procedure Laterality Date   ABDOMINAL HYSTERECTOMY     ovaries left in place   brreast biopsy     FOOT SURGERY     TONSILLECTOMY AND ADENOIDECTOMY     Family History  Problem Relation Age of Onset   Heart disease Mother    Social History   Socioeconomic History   Marital status: Married    Spouse name: Not on file   Number of children: 2   Years of education: Not on file   Highest education level: Not on file  Occupational History   Not on file  Tobacco Use   Smoking status: Never   Smokeless tobacco: Never  Substance and Sexual Activity   Alcohol use: Not on file   Drug use: Never   Sexual activity: Not on file  Other Topics Concern   Not on file  Social History Narrative   Not on file   Social Determinants of Health   Financial Resource Strain: Low Risk  (04/17/2022)   Overall Financial Resource Strain (CARDIA)    Difficulty of Paying Living Expenses: Not hard at  all  Food Insecurity: No Food Insecurity (04/17/2022)   Hunger Vital Sign    Worried About Running Out of Food in the Last Year: Never true    Ran Out of Food in the Last Year: Never true  Transportation Needs: No Transportation Needs (04/17/2022)   PRAPARE - Hydrologist (Medical): No    Lack of Transportation (Non-Medical): No  Physical Activity: Not on file  Stress: No Stress Concern Present (04/17/2022)   [place]    Feeling of Stress : Not at all  Social Connections: Unknown (04/17/2022)   Social Connection and Isolation Panel [NHANES]    Frequency of Communication with Friends and Family: Not on file    Frequency of Social Gatherings with Friends and Family: Not on file    Attends Religious Services: Not on file    Active Member of Clubs or Organizations: Not on file    Attends Archivist Meetings: Not on file    Marital Status: Married     Review of Systems  Constitutional:  Negative for appetite change and unexpected weight change.  HENT:  Negative for congestion  and sinus pressure.   Respiratory:  Negative for cough, chest tightness and shortness of breath.   Cardiovascular:  Negative for chest pain and palpitations.  Gastrointestinal:  Negative for abdominal pain, diarrhea, nausea and vomiting.  Genitourinary:  Negative for difficulty urinating and dysuria.  Musculoskeletal:  Negative for joint swelling and myalgias.  Skin:  Negative for color change and rash.  Neurological:  Negative for dizziness and headaches.  Psychiatric/Behavioral:  Negative for agitation and dysphoric mood.        Objective:     BP 110/62   Pulse 60   Temp 98 F (36.7 C)   Resp 16   Ht 5\' 2"  (1.575 m)   Wt 148 lb (67.1 kg)   SpO2 98%   BMI 27.07 kg/m  Wt Readings from Last 3 Encounters:  01/01/23 148 lb (67.1 kg)  11/29/22 149 lb 3.2 oz (67.7 kg)  08/29/22 149 lb (67.6 kg)     Physical Exam Vitals reviewed.  Constitutional:      General: She is not in acute distress.    Appearance: Normal appearance.  HENT:     Head: Normocephalic and atraumatic.     Right Ear: External ear normal.     Left Ear: External ear normal.  Eyes:     General: No scleral icterus.       Right eye: No discharge.        Left eye: No discharge.     Conjunctiva/sclera: Conjunctivae normal.  Neck:     Thyroid: No thyromegaly.  Cardiovascular:     Rate and Rhythm: Normal rate and regular rhythm.  Pulmonary:     Effort: No respiratory distress.     Breath sounds: Normal breath sounds. No wheezing.  Abdominal:     General: Bowel sounds are normal.     Palpations: Abdomen is soft.     Tenderness: There is no abdominal tenderness.  Musculoskeletal:        General: No swelling or tenderness.     Cervical back: Neck supple. No tenderness.  Lymphadenopathy:     Cervical: No cervical adenopathy.  Skin:    Findings: No erythema or rash.  Neurological:     Mental Status: She is alert.  Psychiatric:        Mood and Affect: Mood normal.        Behavior: Behavior normal.      Outpatient Encounter Medications as of 01/01/2023  Medication Sig   Ascorbic Acid (VITAMIN C) 1000 MG tablet Take 1,000 mg by mouth daily.   aspirin 81 MG chewable tablet Chew 1 tablet by mouth daily.   fluticasone (FLOVENT HFA) 110 MCG/ACT inhaler Inhale 2 puffs into the lungs in the morning and at bedtime.   hydrOXYzine (ATARAX) 10 MG tablet TAKE 1 TABLET BY MOUTH AT BEDTIME AS NEEDED.   levothyroxine (SYNTHROID) 100 MCG tablet TAKE 1 TABLET BY MOUTH EVERY DAY   lisinopril (ZESTRIL) 20 MG tablet Take 1 tablet (20 mg total) by mouth daily.   Magnesium Hydroxide 600 MG CHEW Chew 600 mg by mouth daily.   metFORMIN (GLUCOPHAGE) 500 MG tablet TAKE 1 TABLET BY MOUTH EVERY DAY   Multiple Vitamins-Minerals (CENTRUM SILVER PO) Take 1 tablet by mouth daily.   rosuvastatin (CRESTOR) 20 MG tablet TAKE 1 TABLET BY  MOUTH EVERY DAY   sertraline (ZOLOFT) 100 MG tablet Take 2 tablets (200 mg total) by mouth daily. Take two tablets daily.   tirzepatide (MOUNJARO) 7.5 MG/0.5ML Pen Inject 7.5 mg into  the skin once a week.   vitamin B-12 (CYANOCOBALAMIN) 1000 MCG tablet Take 5,000 mcg by mouth daily.   No facility-administered encounter medications on file as of 01/01/2023.     Lab Results  Component Value Date   WBC 4.8 11/29/2022   HGB 12.5 11/29/2022   HCT 36.8 11/29/2022   PLT 163.0 11/29/2022   GLUCOSE 94 11/29/2022   CHOL 150 11/29/2022   TRIG 87.0 11/29/2022   HDL 63.70 11/29/2022   LDLCALC 69 11/29/2022   ALT 25 11/29/2022   AST 27 11/29/2022   NA 137 11/29/2022   K 3.9 11/29/2022   CL 107 11/29/2022   CREATININE 0.87 11/29/2022   BUN 19 11/29/2022   CO2 30 11/29/2022   TSH 1.04 08/29/2022   HGBA1C 6.1 11/29/2022   MICROALBUR <0.7 08/29/2022    No results found.     Assessment & Plan:  Anxiety Assessment & Plan: On zoloft 200mg  q day.  Appears to be relatively stable on this dose. Main issues is sleep as outlined.  Follow.      DM type 2 with diabetic dyslipidemia ([place]) Assessment & Plan: Low carb diet and exercise.  Continue metformin and mounjaro.  Has lost weight.  Follow met b and a1c.  Lab Results  Component Value Date   HGBA1C 6.1 11/29/2022     Hypertension, unspecified type Assessment & Plan: Blood pressure doing well on lisinopril 20mg  q day.  Follow pressures.  No changes in medication.  Follow metabolic panel.    Hypothyroidism, unspecified type Assessment & Plan: On thyroid replacement.  Follow tsh.    Major depressive disorder, single episode, mild ([place]) Assessment & Plan: On zoloft.  Appears to be relatively stable. Follow.    OSA (obstructive sleep apnea) Assessment & Plan: Continue cpap.    Sleep difficulties Assessment & Plan: Discussed.  Feels hydroxyzine - has opposite effect.  Will stop.  Discussed behavioral modification strategies.   Avoid screen time.  Avoid increased caffeine.  Take zoloft early am. Discussed trazodone.  On high dose of zoloft.  Will follow. Call with update.  Continue cpap. Follow.        Einar Pheasant, MD

## 2023-01-07 ENCOUNTER — Encounter: Payer: Self-pay | Admitting: Internal Medicine

## 2023-01-07 NOTE — Assessment & Plan Note (Signed)
Blood pressure doing well on lisinopril 20mg q day.  Follow pressures.  No changes in medication.  Follow metabolic panel.  

## 2023-01-07 NOTE — Assessment & Plan Note (Signed)
Discussed.  Feels hydroxyzine - has opposite effect.  Will stop.  Discussed behavioral modification strategies.  Avoid screen time.  Avoid increased caffeine.  Take zoloft early am. Discussed trazodone.  On high dose of zoloft.  Will follow. Call with update.  Continue cpap. Follow.

## 2023-01-07 NOTE — Assessment & Plan Note (Signed)
Continue cpap.  

## 2023-01-07 NOTE — Assessment & Plan Note (Signed)
On zoloft.  Appears to be relatively stable. Follow.  

## 2023-01-07 NOTE — Assessment & Plan Note (Signed)
On thyroid replacement.  Follow tsh.  

## 2023-01-07 NOTE — Assessment & Plan Note (Signed)
On zoloft 200mg  q day.  Appears to be relatively stable on this dose. Main issues is sleep as outlined.  Follow.

## 2023-01-07 NOTE — Assessment & Plan Note (Signed)
Low carb diet and exercise.  Continue metformin and mounjaro.  Has lost weight.  Follow met b and a1c.  Lab Results  Component Value Date   HGBA1C 6.1 11/29/2022   

## 2023-01-08 NOTE — Telephone Encounter (Signed)
I had discussed her taking her zoloft earlier in the morning - which it sounds like she is doing.  We had discussed that she felt the hydroxyzine was having the opposite effect on her.  Does she still feel that way? If so, have her stop the hydroxyzine.  Is she taking any melatonin now?  Also, if she does not feel the hydroxyzine is having the opposite effect, then I can adjust the dose of hydroxyzine.

## 2023-01-09 NOTE — Telephone Encounter (Signed)
LMTCB

## 2023-01-10 NOTE — Telephone Encounter (Signed)
Patient aware of below and will stop hydroxyzine, continue her zoloft earlier in the day and try melatonin every night. She will keep [place] posted.

## 2023-01-10 NOTE — Telephone Encounter (Signed)
Agree with stopping the hydroxyzine and seeing if this is keeping her from sleeping.  What we had discussed was taking the zoloft earlier in am (which it appears she is now doing).  Also had discussed taking melatonin earlier and scheduled to see  if would help.  Keep [place] posted.

## 2023-01-10 NOTE — Telephone Encounter (Signed)
Patient says she slept good 2 night and then the other 2 she didn't sleep. Feels like hydroxyzine has opposite effect on her- advised to stop unless she hears other wise. Not taking melatonin- says she tried it with no success.

## 2023-01-16 ENCOUNTER — Encounter: Payer: Self-pay | Admitting: Pulmonary Disease

## 2023-01-16 ENCOUNTER — Telehealth (INDEPENDENT_AMBULATORY_CARE_PROVIDER_SITE_OTHER): Payer: Medicare Other | Admitting: Pulmonary Disease

## 2023-01-16 DIAGNOSIS — G4733 Obstructive sleep apnea (adult) (pediatric): Secondary | ICD-10-CM

## 2023-01-16 NOTE — Patient Instructions (Signed)
Follow up in 1 year.

## 2023-01-16 NOTE — Progress Notes (Signed)
[facility]  Chief Complaint  Patient presents with   Follow-up    Sleep apnea    Past Surgical History:  She  has a past surgical history that includes Abdominal hysterectomy; Foot surgery; Tonsillectomy and adenoidectomy; and brreast biopsy.  Past Medical History:  Depression, DM, Carpal tunnel, HLD, HTN, Hypothyroidism, Lt breast cancer, CAD  Constitutional:  There were no vitals taken for this visit.  Brief Summary:  Tonya Greer is a 67 y.o. female with obstructive sleep apnea.      Subjective:  Virtual Visit via Video Note  I connected with Tonya Greer on 01/16/23 at  3:30 PM EDT by a video enabled telemedicine application and verified that I am speaking with the correct person using two identifiers.  Location: Patient: car Provider: medical office   I discussed the limitations of evaluation and management by telemedicine and the availability of in person appointments. The patient expressed understanding and agreed to proceed.  History of Present Illness: Uses CPAP nightly.  No issues with mask fit or pressure.  Not having sinus congestion or dry mouth.  Feels rested.  Hasn't needed to use flovent.  Physical Exam:  Alert, no distress, speech fluent    Pulmonary Tests:    Sleep Tests:  PSG 02/17/15 >> AHI 10, SpO2 low 85.6% CPAP 12/15/22 to 01/13/23 >> used on 23 of 30 nights with average 8 hrs 55 min.  Average AHI 1.2 with CPAP 8 cm H2O  Social History:  She  reports that she has never smoked. She has never used smokeless tobacco. She reports that she does not use drugs.  Family History:  Her family history includes Heart disease in her mother.     Assessment/Plan:   Obstructive sleep apnea. - she is compliant with CPAP and reports benefit from therapy - she uses [employer] in [place] for her DME - current CPAP ordered March 2022 - continue CPAP 8 cm H2O  Cough variant asthma. - prn flovent  Coronary  artery disease. - followed by Dr. Marney Setting with [facility]  Left breast cancer.  - followed by Dr. Leone Haven with [facility]  Obesity. - discussed how weight can impact sleep and risk for sleep disordered breathing - discussed options to assist with weight loss: combination of diet modification, cardiovascular and strength training exercises  Time Spent Involved in Patient Care on Day of Examination:   I discussed the assessment and treatment plan with the patient. The patient was provided an opportunity to ask questions and all were answered. The patient agreed with the plan and demonstrated an understanding of the instructions.   The patient was advised to call back or seek an in-person evaluation if the symptoms worsen or if the condition fails to improve as anticipated.  I provided 15 minutes of non-face-to-face time during this encounter.   Follow up:   Patient Instructions  Follow up in 1 year  Medication List:   Allergies as of 01/16/2023       Reactions   Hydrocodone Itching   Oxycodone Itching        Medication List        Accurate as of January 16, 2023  2:52 PM. If you have any questions, ask your nurse or doctor.          aspirin 81 MG chewable tablet Chew 1 tablet by mouth daily.   CENTRUM SILVER PO Take 1 tablet by mouth daily.   cyanocobalamin 1000 MCG  tablet Commonly known as: VITAMIN B12 Take 5,000 mcg by mouth daily.   fluticasone 110 MCG/ACT inhaler Commonly known as: Flovent HFA Inhale 2 puffs into the lungs in the morning and at bedtime.   hydrOXYzine 10 MG tablet Commonly known as: ATARAX TAKE 1 TABLET BY MOUTH AT BEDTIME AS NEEDED.   levothyroxine 100 MCG tablet Commonly known as: SYNTHROID TAKE 1 TABLET BY MOUTH EVERY DAY   lisinopril 20 MG tablet Commonly known as: ZESTRIL Take 1 tablet (20 mg total) by mouth daily.   Magnesium Hydroxide 600 MG Chew Chew 600 mg by mouth daily.   metFORMIN 500 MG  tablet Commonly known as: GLUCOPHAGE TAKE 1 TABLET BY MOUTH EVERY DAY   rosuvastatin 20 MG tablet Commonly known as: CRESTOR TAKE 1 TABLET BY MOUTH EVERY DAY   sertraline 100 MG tablet Commonly known as: ZOLOFT Take 2 tablets (200 mg total) by mouth daily. Take two tablets daily.   tirzepatide 7.5 MG/0.5ML Pen Commonly known as: MOUNJARO Inject 7.5 mg into the skin once a week.   vitamin C 1000 MG tablet Take 1,000 mg by mouth daily.        Signature:  Chesley Mires, MD [place] Pager - [phone] 01/16/2023, 2:52 PM

## 2023-01-17 ENCOUNTER — Other Ambulatory Visit: Payer: Self-pay

## 2023-01-18 ENCOUNTER — Other Ambulatory Visit: Payer: Self-pay | Admitting: Internal Medicine

## 2023-02-13 DIAGNOSIS — I2510 Atherosclerotic heart disease of native coronary artery without angina pectoris: Secondary | ICD-10-CM | POA: Diagnosis not present

## 2023-02-13 DIAGNOSIS — M810 Age-related osteoporosis without current pathological fracture: Secondary | ICD-10-CM | POA: Diagnosis not present

## 2023-02-13 DIAGNOSIS — Z780 Asymptomatic menopausal state: Secondary | ICD-10-CM | POA: Diagnosis not present

## 2023-02-13 DIAGNOSIS — M8580 Other specified disorders of bone density and structure, unspecified site: Secondary | ICD-10-CM | POA: Diagnosis not present

## 2023-02-13 DIAGNOSIS — Z170 Estrogen receptor positive status [ER+]: Secondary | ICD-10-CM | POA: Diagnosis not present

## 2023-02-13 DIAGNOSIS — D472 Monoclonal gammopathy: Secondary | ICD-10-CM | POA: Diagnosis not present

## 2023-02-13 DIAGNOSIS — C50412 Malignant neoplasm of upper-outer quadrant of left female breast: Secondary | ICD-10-CM | POA: Diagnosis not present

## 2023-02-13 LAB — HM DEXA SCAN

## 2023-02-14 ENCOUNTER — Other Ambulatory Visit: Payer: Self-pay

## 2023-02-22 ENCOUNTER — Other Ambulatory Visit: Payer: Self-pay

## 2023-02-23 ENCOUNTER — Other Ambulatory Visit: Payer: Self-pay

## 2023-03-06 ENCOUNTER — Encounter: Payer: Self-pay | Admitting: Internal Medicine

## 2023-03-13 NOTE — Telephone Encounter (Signed)
Fasting labs scheduled. Future orders in

## 2023-03-14 ENCOUNTER — Other Ambulatory Visit (INDEPENDENT_AMBULATORY_CARE_PROVIDER_SITE_OTHER): Payer: Medicare Other

## 2023-03-14 DIAGNOSIS — E1169 Type 2 diabetes mellitus with other specified complication: Secondary | ICD-10-CM

## 2023-03-14 DIAGNOSIS — Z7984 Long term (current) use of oral hypoglycemic drugs: Secondary | ICD-10-CM

## 2023-03-14 DIAGNOSIS — E785 Hyperlipidemia, unspecified: Secondary | ICD-10-CM | POA: Diagnosis not present

## 2023-03-14 LAB — LIPID PANEL
Cholesterol: 203 mg/dL — ABNORMAL HIGH (ref 0–200)
HDL: 63.1 mg/dL (ref >39.00)
LDL Cholesterol: 119 mg/dL — ABNORMAL HIGH (ref 0–99)
NonHDL: 139.73
Total CHOL/HDL Ratio: 3
Triglycerides: 104 mg/dL (ref 0.0–149.0)
VLDL: 20.8 mg/dL (ref 0.0–40.0)

## 2023-03-14 LAB — HEMOGLOBIN A1C: Hgb A1c MFr Bld: 5.9 % (ref 4.6–6.5)

## 2023-03-14 LAB — BASIC METABOLIC PANEL
BUN: 18 mg/dL (ref 6–23)
CO2: 30 mEq/L (ref 19–32)
Calcium: 9 mg/dL (ref 8.4–10.5)
Chloride: 103 mEq/L (ref 96–112)
Creatinine, Ser: 1.08 mg/dL (ref 0.40–1.20)
GFR: 52.97 mL/min — ABNORMAL LOW (ref >60.00)
Glucose, Bld: 95 mg/dL (ref 70–99)
Potassium: 4 mEq/L (ref 3.5–5.1)
Sodium: 139 mEq/L (ref 135–145)

## 2023-03-14 LAB — HEPATIC FUNCTION PANEL
ALT: 16 U/L (ref 0–35)
AST: 18 U/L (ref 0–37)
Albumin: 3.9 g/dL (ref 3.5–5.2)
Alkaline Phosphatase: 48 U/L (ref 39–117)
Bilirubin, Direct: 0 mg/dL (ref 0.0–0.3)
Total Bilirubin: 0.3 mg/dL (ref 0.2–1.2)
Total Protein: 6.5 g/dL (ref 6.0–8.3)

## 2023-03-15 ENCOUNTER — Ambulatory Visit (INDEPENDENT_AMBULATORY_CARE_PROVIDER_SITE_OTHER): Payer: Medicare Other | Admitting: Internal Medicine

## 2023-03-15 VITALS — BP 122/70 | HR 60 | Ht 62.0 in | Wt 152.0 lb

## 2023-03-15 DIAGNOSIS — G720 Drug-induced myopathy: Secondary | ICD-10-CM

## 2023-03-15 DIAGNOSIS — T466X5A Adverse effect of antihyperlipidemic and antiarteriosclerotic drugs, initial encounter: Secondary | ICD-10-CM

## 2023-03-15 DIAGNOSIS — F419 Anxiety disorder, unspecified: Secondary | ICD-10-CM

## 2023-03-15 DIAGNOSIS — I10 Essential (primary) hypertension: Secondary | ICD-10-CM

## 2023-03-15 DIAGNOSIS — D3131 Benign neoplasm of right choroid: Secondary | ICD-10-CM | POA: Diagnosis not present

## 2023-03-15 DIAGNOSIS — G4733 Obstructive sleep apnea (adult) (pediatric): Secondary | ICD-10-CM

## 2023-03-15 DIAGNOSIS — G479 Sleep disorder, unspecified: Secondary | ICD-10-CM

## 2023-03-15 DIAGNOSIS — H5203 Hypermetropia, bilateral: Secondary | ICD-10-CM | POA: Diagnosis not present

## 2023-03-15 DIAGNOSIS — E1169 Type 2 diabetes mellitus with other specified complication: Secondary | ICD-10-CM | POA: Diagnosis not present

## 2023-03-15 DIAGNOSIS — Z7984 Long term (current) use of oral hypoglycemic drugs: Secondary | ICD-10-CM

## 2023-03-15 DIAGNOSIS — I700 Atherosclerosis of aorta: Secondary | ICD-10-CM

## 2023-03-15 DIAGNOSIS — F320 Major depressive disorder, single episode, mild: Secondary | ICD-10-CM

## 2023-03-15 DIAGNOSIS — E039 Hypothyroidism, unspecified: Secondary | ICD-10-CM

## 2023-03-15 DIAGNOSIS — E119 Type 2 diabetes mellitus without complications: Secondary | ICD-10-CM | POA: Diagnosis not present

## 2023-03-15 DIAGNOSIS — H2513 Age-related nuclear cataract, bilateral: Secondary | ICD-10-CM | POA: Diagnosis not present

## 2023-03-15 MED ORDER — EZETIMIBE 10 MG PO TABS: 10.0000 mg | ORAL_TABLET | Freq: Every day | ORAL | 0 refills | Status: DC

## 2023-03-15 MED ORDER — TRAZODONE HCL 50 MG PO TABS: 50.0000 mg | ORAL_TABLET | Freq: Every evening | ORAL | 0 refills | Status: DC | PRN

## 2023-03-15 NOTE — Progress Notes (Signed)
Subjective:    Patient ID: Tonya Greer, female    DOB: 10/13/1956, 67 y.o.   MRN: [id number]  Patient here for  Chief Complaint  Patient presents with   Medical Management of Chronic Issues    HPI Here as a work in to discuss cholesterol treatment options.  Also had concerns regarding her sleep.  She recently stopped crestor. Feels much better off the crestor.  Discussed other treatment options - which include - trial of another statin, lower dose crestor, zetia or repatha.  Continue diet and exercise.  Also discussed persistent issues with sleep.  She has been taking some of her son-n-law's trazodone.  Sleeping well with this medication.  Discussed taking this with increased dose of zoloft.  Discussed trial of trazodone and seeing if we could decrease zoloft.  No chest pain or sob reported.     Past Medical History:  Diagnosis Date   Depression    Diabetes mellitus without complication (HCC)    History of carpal tunnel syndrome    Hyperlipidemia    Hypertension    Hypothyroidism    OSA (obstructive sleep apnea)    Past Surgical History:  Procedure Laterality Date   ABDOMINAL HYSTERECTOMY     ovaries left in place   brreast biopsy     FOOT SURGERY     TONSILLECTOMY AND ADENOIDECTOMY     Family History  Problem Relation Age of Onset   Heart disease Mother    Social History   Socioeconomic History   Marital status: Married    Spouse name: Not on file   Number of children: 2   Years of education: Not on file   Highest education level: Associate degree: occupational, Scientist, product/process development, or vocational program  Occupational History   Not on file  Tobacco Use   Smoking status: Never   Smokeless tobacco: Never  Substance and Sexual Activity   Alcohol use: Not on file   Drug use: Never   Sexual activity: Not on file  Other Topics Concern   Not on file  Social History Narrative   Not on file   Social Determinants of Health   Financial Resource Strain: Low Risk   (03/15/2023)   Overall Financial Resource Strain (CARDIA)    Difficulty of Paying Living Expenses: Not hard at all  Food Insecurity: No Food Insecurity (03/15/2023)   Hunger Vital Sign    Worried About Running Out of Food in the Last Year: Never true    Ran Out of Food in the Last Year: Never true  Transportation Needs: No Transportation Needs (03/15/2023)   PRAPARE - Administrator, Civil Service (Medical): No    Lack of Transportation (Non-Medical): No  Physical Activity: Insufficiently Active (03/15/2023)   Exercise Vital Sign    Days of Exercise per Week: 3 days    Minutes of Exercise per Session: 30 min  Stress: No Stress Concern Present (03/15/2023)   [employer] of Occupational Health - Occupational Stress Questionnaire    Feeling of Stress : Not at all  Social Connections: Unknown (03/15/2023)   Social Connection and Isolation Panel [NHANES]    Frequency of Communication with Friends and Family: More than three times a week    Frequency of Social Gatherings with Friends and Family: More than three times a week    Attends Religious Services: Patient declined    Active Member of Clubs or Organizations: Patient declined    Attends Banker Meetings: Not on file  Marital Status: Married     Review of Systems  Constitutional:  Negative for appetite change and unexpected weight change.  HENT:  Negative for congestion and sinus pressure.   Respiratory:  Negative for cough, chest tightness and shortness of breath.   Cardiovascular:  Negative for chest pain and palpitations.  Gastrointestinal:  Negative for abdominal pain, diarrhea, nausea and vomiting.  Genitourinary:  Negative for difficulty urinating and dysuria.  Musculoskeletal:  Negative for joint swelling and myalgias.  Skin:  Negative for color change and rash.  Neurological:  Negative for dizziness and headaches.  Psychiatric/Behavioral:  Negative for agitation and dysphoric mood.         Objective:     BP 122/70   Pulse 60   Ht 5\' 2"  (1.575 m)   Wt 152 lb (68.9 kg)   SpO2 97%   BMI 27.80 kg/m  Wt Readings from Last 3 Encounters:  03/18/23 152 lb (68.9 kg)  01/01/23 148 lb (67.1 kg)  11/29/22 149 lb 3.2 oz (67.7 kg)    Physical Exam Vitals reviewed.  Constitutional:      General: She is not in acute distress.    Appearance: Normal appearance.  HENT:     Head: Normocephalic and atraumatic.     Right Ear: External ear normal.     Left Ear: External ear normal.  Eyes:     General: No scleral icterus.       Right eye: No discharge.        Left eye: No discharge.     Conjunctiva/sclera: Conjunctivae normal.  Neck:     Thyroid: No thyromegaly.  Cardiovascular:     Rate and Rhythm: Normal rate and regular rhythm.  Pulmonary:     Effort: No respiratory distress.     Breath sounds: Normal breath sounds. No wheezing.  Abdominal:     General: Bowel sounds are normal.     Palpations: Abdomen is soft.     Tenderness: There is no abdominal tenderness.  Musculoskeletal:        General: No swelling or tenderness.     Cervical back: Neck supple. No tenderness.  Lymphadenopathy:     Cervical: No cervical adenopathy.  Skin:    Findings: No erythema or rash.  Neurological:     Mental Status: She is alert.  Psychiatric:        Mood and Affect: Mood normal.        Behavior: Behavior normal.      Outpatient Encounter Medications as of 03/15/2023  Medication Sig   ezetimibe (ZETIA) 10 MG tablet Take 1 tablet (10 mg total) by mouth daily.   traZODone (DESYREL) 50 MG tablet Take 1 tablet (50 mg total) by mouth at bedtime as needed for sleep.   Ascorbic Acid (VITAMIN C) 1000 MG tablet Take 1,000 mg by mouth daily.   aspirin 81 MG chewable tablet Chew 1 tablet by mouth daily.   fluticasone (FLOVENT HFA) 110 MCG/ACT inhaler Inhale 2 puffs into the lungs in the morning and at bedtime.   levothyroxine (SYNTHROID) 100 MCG tablet TAKE 1 TABLET BY MOUTH EVERY DAY    lisinopril (ZESTRIL) 20 MG tablet TAKE 1 TABLET BY MOUTH EVERY DAY   Magnesium Hydroxide 600 MG CHEW Chew 600 mg by mouth daily.   metFORMIN (GLUCOPHAGE) 500 MG tablet TAKE 1 TABLET BY MOUTH EVERY DAY   Multiple Vitamins-Minerals (CENTRUM SILVER PO) Take 1 tablet by mouth daily.   sertraline (ZOLOFT) 100 MG tablet Take 2 tablets (  200 mg total) by mouth daily. Take two tablets daily.   tirzepatide (MOUNJARO) 7.5 MG/0.5ML Pen Inject 7.5 mg into the skin once a week.   vitamin B-12 (CYANOCOBALAMIN) 1000 MCG tablet Take 5,000 mcg by mouth daily.   [DISCONTINUED] hydrOXYzine (ATARAX) 10 MG tablet TAKE 1 TABLET BY MOUTH AT BEDTIME AS NEEDED.   [DISCONTINUED] rosuvastatin (CRESTOR) 20 MG tablet TAKE 1 TABLET BY MOUTH EVERY DAY   No facility-administered encounter medications on file as of 03/15/2023.     Lab Results  Component Value Date   WBC 4.8 11/29/2022   HGB 12.5 11/29/2022   HCT 36.8 11/29/2022   PLT 163.0 11/29/2022   GLUCOSE 95 03/14/2023   CHOL 203 (H) 03/14/2023   TRIG 104.0 03/14/2023   HDL 63.10 03/14/2023   LDLCALC 119 (H) 03/14/2023   ALT 16 03/14/2023   AST 18 03/14/2023   NA 139 03/14/2023   K 4.0 03/14/2023   CL 103 03/14/2023   CREATININE 1.08 03/14/2023   BUN 18 03/14/2023   CO2 30 03/14/2023   TSH 1.04 08/29/2022   HGBA1C 5.9 03/14/2023   MICROALBUR <0.7 08/29/2022    No results found.     Assessment & Plan:  DM type 2 with diabetic dyslipidemia (HCC) Assessment & Plan: The 10-year ASCVD risk score (Arnett DK, et al., 2019) is: 25.7%   Values used to calculate the score:     Age: 66 years     Sex: Female     Is Non-Hispanic African American: No     Diabetic: Yes     Tobacco smoker: No     Systolic Blood Pressure: 155 mmHg     Is BP treated: Yes     HDL Cholesterol: 63.1 mg/dL     Total Cholesterol: 203 mg/dL  Low carb diet and exercise.  Continue metformin and mounjaro.  Has lost weight.  Follow met b and a1c.  Lab Results  Component Value Date    HGBA1C 5.9 03/14/2023     Statin myopathy Assessment & Plan: Feels better off crestor.  Discussed treatment options.     Anxiety Assessment & Plan: On zoloft 200mg  q day.  Having trouble sleeping.  Wants to continue trazodone.  Start 50mg  trazodone q hs.  Decrease zoloft - with plans to decrease to 150mg  q day.  Follow.  Discussed possible side effects.     Aortic atherosclerosis (HCC) Assessment & Plan: Has been on crestor.  Feels better off.  Discussed treatment options.  Start zetia.    Hypertension, unspecified type Assessment & Plan: Blood pressure doing well on lisinopril 20mg  q day.  Follow pressures.  No changes in medication.  Follow metabolic panel.    Hypothyroidism, unspecified type Assessment & Plan: On thyroid replacement.  Follow tsh.    Major depressive disorder, single episode, mild (HCC) Assessment & Plan: On zoloft 200mg  q day.  Having trouble sleeping.  Wants to continue trazodone.  Start 50mg  trazodone q hs.  Decrease zoloft - with plans to decrease to 150mg  q day.  Follow.  Discussed possible side effects.    OSA (obstructive sleep apnea) Assessment & Plan: Continue cpap.    Sleep difficulties Assessment & Plan: Trial of trazodone as outlined.  Adjust zoloft dose.  Follow.    Other orders -     traZODone HCl; Take 1 tablet (50 mg total) by mouth at bedtime as needed for sleep.  Dispense: 30 tablet; Refill: 0 -     Ezetimibe; Take 1 tablet (10  mg total) by mouth daily.  Dispense: 30 tablet; Refill: 0     Dale [place], MD

## 2023-03-15 NOTE — Assessment & Plan Note (Addendum)
The 10-year ASCVD risk score (Arnett DK, et al., 2019) is: 25.7%   Values used to calculate the score:     Age: 67 years     Sex: Female     Is Non-Hispanic African American: No     Diabetic: Yes     Tobacco smoker: No     Systolic Blood Pressure: 155 mmHg     Is BP treated: Yes     HDL Cholesterol: 63.1 mg/dL     Total Cholesterol: 203 mg/dL  Low carb diet and exercise.  Continue metformin and mounjaro.  Has lost weight.  Follow met b and a1c.  Lab Results  Component Value Date   HGBA1C 5.9 03/14/2023

## 2023-03-18 ENCOUNTER — Encounter: Payer: Self-pay | Admitting: Internal Medicine

## 2023-03-18 DIAGNOSIS — G720 Drug-induced myopathy: Secondary | ICD-10-CM | POA: Insufficient documentation

## 2023-03-18 NOTE — Assessment & Plan Note (Addendum)
On zoloft 200mg q day.  Having trouble sleeping.  Wants to continue trazodone.  Start 50mg trazodone q hs.  Decrease zoloft - with plans to decrease to 150mg q day.  Follow.  Discussed possible side effects.   

## 2023-03-18 NOTE — Assessment & Plan Note (Signed)
On thyroid replacement.  Follow tsh.  

## 2023-03-18 NOTE — Assessment & Plan Note (Signed)
On zoloft 200mg  q day.  Having trouble sleeping.  Wants to continue trazodone.  Start 50mg  trazodone q hs.  Decrease zoloft - with plans to decrease to 150mg  q day.  Follow.  Discussed possible side effects.

## 2023-03-18 NOTE — Assessment & Plan Note (Signed)
Feels better off crestor.  Discussed treatment options.

## 2023-03-18 NOTE — Assessment & Plan Note (Signed)
Blood pressure doing well on lisinopril 20mg q day.  Follow pressures.  No changes in medication.  Follow metabolic panel.  

## 2023-03-18 NOTE — Assessment & Plan Note (Signed)
Continue cpap.  

## 2023-03-18 NOTE — Assessment & Plan Note (Signed)
Trial of trazodone as outlined.  Adjust zoloft dose.  Follow.

## 2023-03-18 NOTE — Assessment & Plan Note (Signed)
Has been on crestor.  Feels better off.  Discussed treatment options.  Start zetia.

## 2023-03-26 ENCOUNTER — Other Ambulatory Visit: Payer: Self-pay

## 2023-04-02 ENCOUNTER — Encounter: Payer: Self-pay | Admitting: Internal Medicine

## 2023-04-08 ENCOUNTER — Other Ambulatory Visit: Payer: Self-pay | Admitting: Internal Medicine

## 2023-04-09 NOTE — Telephone Encounter (Signed)
Rx ok'd for trazodone and zetia.

## 2023-04-19 ENCOUNTER — Other Ambulatory Visit: Payer: Self-pay

## 2023-04-24 ENCOUNTER — Encounter: Payer: Self-pay | Admitting: Internal Medicine

## 2023-04-24 ENCOUNTER — Telehealth (INDEPENDENT_AMBULATORY_CARE_PROVIDER_SITE_OTHER): Payer: Medicare Other | Admitting: Internal Medicine

## 2023-04-24 VITALS — Ht 62.0 in | Wt 144.0 lb

## 2023-04-24 DIAGNOSIS — I700 Atherosclerosis of aorta: Secondary | ICD-10-CM

## 2023-04-24 DIAGNOSIS — D472 Monoclonal gammopathy: Secondary | ICD-10-CM

## 2023-04-24 DIAGNOSIS — F320 Major depressive disorder, single episode, mild: Secondary | ICD-10-CM

## 2023-04-24 DIAGNOSIS — E785 Hyperlipidemia, unspecified: Secondary | ICD-10-CM

## 2023-04-24 DIAGNOSIS — G720 Drug-induced myopathy: Secondary | ICD-10-CM | POA: Diagnosis not present

## 2023-04-24 DIAGNOSIS — G479 Sleep disorder, unspecified: Secondary | ICD-10-CM | POA: Diagnosis not present

## 2023-04-24 DIAGNOSIS — G4733 Obstructive sleep apnea (adult) (pediatric): Secondary | ICD-10-CM

## 2023-04-24 DIAGNOSIS — E039 Hypothyroidism, unspecified: Secondary | ICD-10-CM

## 2023-04-24 DIAGNOSIS — E1169 Type 2 diabetes mellitus with other specified complication: Secondary | ICD-10-CM | POA: Diagnosis not present

## 2023-04-24 DIAGNOSIS — T466X5A Adverse effect of antihyperlipidemic and antiarteriosclerotic drugs, initial encounter: Secondary | ICD-10-CM

## 2023-04-24 DIAGNOSIS — I10 Essential (primary) hypertension: Secondary | ICD-10-CM

## 2023-04-24 NOTE — Assessment & Plan Note (Signed)
Continue cpap.  

## 2023-04-24 NOTE — Assessment & Plan Note (Signed)
The 10-year ASCVD risk score (Arnett DK, et al., 2019) is: 16.7%   Values used to calculate the score:     Age: 67 years     Sex: Female     Is Non-Hispanic African American: No     Diabetic: Yes     Tobacco smoker: No     Systolic Blood Pressure: 122 mmHg     Is BP treated: Yes     HDL Cholesterol: 63.1 mg/dL     Total Cholesterol: 203 mg/dL  Low carb diet and exercise.  Continue metformin and mounjaro.  Has lost weight.  Follow met b and a1c.  Lab Results  Component Value Date   HGBA1C 5.9 03/14/2023

## 2023-04-24 NOTE — Assessment & Plan Note (Signed)
Statin intolerance.  On zetia.

## 2023-04-24 NOTE — Assessment & Plan Note (Signed)
On thyroid replacement.  Follow tsh.  

## 2023-04-24 NOTE — Assessment & Plan Note (Signed)
On zoloft 200mg  alternating with 150mg  every other day. Tolerating.  Sleeping better.  On trazodone.  Continue to taper zoloft to 150mg  q day.  Feels better.

## 2023-04-24 NOTE — Assessment & Plan Note (Signed)
Followed by oncology 

## 2023-04-24 NOTE — Assessment & Plan Note (Signed)
Intolerant to statin (crestor).  On zetia and tolerating.

## 2023-04-24 NOTE — Assessment & Plan Note (Signed)
Sleeping better/well taking trazodone.  Follow.

## 2023-04-24 NOTE — Progress Notes (Signed)
Patient ID: Tonya Greer, female   DOB: 02-15-1956, 67 y.o.   MRN: [id number]   Virtual Visit via video Note  I connected with Tonya Greer by a video enabled telemedicine application and verified that I am speaking with the correct person using two identifiers. Location patient: home Location provider: work  Persons participating in the virtual visit: patient, provider  The limitations, risks, security and privacy concerns of performing an evaluation and management service by video and the availability of in person appointments have been discussed.  It has also been discussed with the patient that there may be a patient responsible charge related to this service. The patient expressed understanding and agreed to proceed.   Reason for visit: follow up appt  HPI: Follow up regarding hypercholesterolemia and sleep issues.  Recently stopped crestor.  Started zetia. Felt better after stopping. Tolerating zetia. Was also started on trazodone.  Doing well on this medication.  Sleeping better.  Feels better.  Is currently taking zoloft 200mg  alternating with 150mg  every other day.  Discussed trying to taper to 150mg  q day.  Staying active.  Working in her yard.  No chest pain or sob reported.  No bowel issues reported.  Tolerating mounjaro.   ROS: See pertinent positives and negatives per HPI.  Past Medical History:  Diagnosis Date   Depression    Diabetes mellitus without complication (HCC)    History of carpal tunnel syndrome    Hyperlipidemia    Hypertension    Hypothyroidism    OSA (obstructive sleep apnea)     Past Surgical History:  Procedure Laterality Date   ABDOMINAL HYSTERECTOMY     ovaries left in place   brreast biopsy     FOOT SURGERY     TONSILLECTOMY AND ADENOIDECTOMY      Family History  Problem Relation Age of Onset   Heart disease Mother     SOCIAL HX: reviewed.    Current Outpatient Medications:    Ascorbic Acid (VITAMIN C) 1000 MG tablet, Take 1,000 mg by  mouth daily., Disp: , Rfl:    aspirin 81 MG chewable tablet, Chew 1 tablet by mouth daily., Disp: , Rfl:    ezetimibe (ZETIA) 10 MG tablet, TAKE 1 TABLET BY MOUTH EVERY DAY, Disp: 90 tablet, Rfl: 1   fluticasone (FLOVENT HFA) 110 MCG/ACT inhaler, Inhale 2 puffs into the lungs in the morning and at bedtime., Disp: 1 each, Rfl: 2   levothyroxine (SYNTHROID) 100 MCG tablet, TAKE 1 TABLET BY MOUTH EVERY DAY, Disp: 90 tablet, Rfl: 1   lisinopril (ZESTRIL) 20 MG tablet, TAKE 1 TABLET BY MOUTH EVERY DAY, Disp: 90 tablet, Rfl: 3   Magnesium Hydroxide 600 MG CHEW, Chew 600 mg by mouth daily., Disp: , Rfl:    metFORMIN (GLUCOPHAGE) 500 MG tablet, TAKE 1 TABLET BY MOUTH EVERY DAY, Disp: 90 tablet, Rfl: 1   Multiple Vitamins-Minerals (CENTRUM SILVER PO), Take 1 tablet by mouth daily., Disp: , Rfl:    sertraline (ZOLOFT) 100 MG tablet, Take 2 tablets (200 mg total) by mouth daily. Take two tablets daily., Disp: 180 tablet, Rfl: 1   tirzepatide (MOUNJARO) 7.5 MG/0.5ML Pen, Inject 7.5 mg into the skin once a week., Disp: 6 mL, Rfl: 1   traZODone (DESYREL) 50 MG tablet, TAKE 1 TABLET BY MOUTH AT BEDTIME AS NEEDED FOR SLEEP., Disp: 30 tablet, Rfl: 1   vitamin B-12 (CYANOCOBALAMIN) 1000 MCG tablet, Take 5,000 mcg by mouth daily., Disp: , Rfl:   EXAM:  GENERAL: alert,  oriented, appears well and in no acute distress  HEENT: atraumatic, conjunttiva clear, no obvious abnormalities on inspection of external nose and ears  NECK: normal movements of the head and neck  LUNGS: on inspection no signs of respiratory distress, breathing rate appears normal, no obvious gross SOB, gasping or wheezing  CV: no obvious cyanosis  PSYCH/NEURO: pleasant and cooperative, no obvious depression or anxiety, speech and thought processing grossly intact  ASSESSMENT AND PLAN:  Discussed the following assessment and plan:  Problem List Items Addressed This Visit     Statin myopathy    Intolerant to statin (crestor).  On zetia  and tolerating.       Sleep difficulties    Sleeping better/well taking trazodone.  Follow.       OSA (obstructive sleep apnea)    Continue cpap.       MGUS (monoclonal gammopathy of unknown significance)    Followed by oncology.       Major depressive disorder, single episode, mild (HCC)    On zoloft 200mg  alternating with 150mg  every other day. Tolerating.  Sleeping better.  On trazodone.  Continue to taper zoloft to 150mg  q day.  Feels better.       Hypothyroid    On thyroid replacement.  Follow tsh.       Hypertension    Blood pressure has been doing well on lisinopril 20mg  q day.  Follow pressures.  No changes in medication.  Follow metabolic panel.       DM type 2 with diabetic dyslipidemia (HCC) - Primary    The 10-year ASCVD risk score (Arnett DK, et al., 2019) is: 16.7%   Values used to calculate the score:     Age: 74 years     Sex: Female     Is Non-Hispanic African American: No     Diabetic: Yes     Tobacco smoker: No     Systolic Blood Pressure: 122 mmHg     Is BP treated: Yes     HDL Cholesterol: 63.1 mg/dL     Total Cholesterol: 203 mg/dL  Low carb diet and exercise.  Continue metformin and mounjaro.  Has lost weight.  Follow met b and a1c.  Lab Results  Component Value Date   HGBA1C 5.9 03/14/2023       Aortic atherosclerosis (HCC)    Statin intolerance.  On zetia.       Return in about 3 months (around 07/25/2023) for follow-up with fasting labs 2 days prior.   I discussed the assessment and treatment plan with the patient. The patient was provided an opportunity to ask questions and all were answered. The patient agreed with the plan and demonstrated an understanding of the instructions.   The patient was advised to call back or seek an in-person evaluation if the symptoms worsen or if the condition fails to improve as anticipated.    Dale Vandiver, MD

## 2023-04-24 NOTE — Assessment & Plan Note (Signed)
Blood pressure has been doing well on lisinopril 20mg  q day.  Follow pressures.  No changes in medication.  Follow metabolic panel.

## 2023-05-07 ENCOUNTER — Other Ambulatory Visit: Payer: Self-pay | Admitting: Internal Medicine

## 2023-05-08 ENCOUNTER — Other Ambulatory Visit: Payer: Self-pay | Admitting: Internal Medicine

## 2023-05-17 ENCOUNTER — Other Ambulatory Visit: Payer: Self-pay

## 2023-05-30 ENCOUNTER — Other Ambulatory Visit: Payer: Self-pay | Admitting: Internal Medicine

## 2023-06-13 DIAGNOSIS — L720 Epidermal cyst: Secondary | ICD-10-CM | POA: Diagnosis not present

## 2023-06-13 DIAGNOSIS — D225 Melanocytic nevi of trunk: Secondary | ICD-10-CM | POA: Diagnosis not present

## 2023-06-13 DIAGNOSIS — L821 Other seborrheic keratosis: Secondary | ICD-10-CM | POA: Diagnosis not present

## 2023-06-14 ENCOUNTER — Other Ambulatory Visit: Payer: Self-pay

## 2023-07-02 ENCOUNTER — Encounter: Payer: Self-pay | Admitting: Internal Medicine

## 2023-07-03 NOTE — Telephone Encounter (Signed)
Yes    Ok to schedule

## 2023-07-09 ENCOUNTER — Encounter: Payer: Self-pay | Admitting: Internal Medicine

## 2023-07-09 ENCOUNTER — Telehealth (INDEPENDENT_AMBULATORY_CARE_PROVIDER_SITE_OTHER): Payer: Medicare Other | Admitting: Internal Medicine

## 2023-07-09 DIAGNOSIS — E785 Hyperlipidemia, unspecified: Secondary | ICD-10-CM

## 2023-07-09 DIAGNOSIS — I700 Atherosclerosis of aorta: Secondary | ICD-10-CM | POA: Diagnosis not present

## 2023-07-09 DIAGNOSIS — E1169 Type 2 diabetes mellitus with other specified complication: Secondary | ICD-10-CM

## 2023-07-09 DIAGNOSIS — I10 Essential (primary) hypertension: Secondary | ICD-10-CM

## 2023-07-09 DIAGNOSIS — G720 Drug-induced myopathy: Secondary | ICD-10-CM

## 2023-07-09 DIAGNOSIS — T466X5A Adverse effect of antihyperlipidemic and antiarteriosclerotic drugs, initial encounter: Secondary | ICD-10-CM

## 2023-07-09 MED ORDER — REPATHA SURECLICK 140 MG/ML ~~LOC~~ SOAJ: 140.0000 mg | SUBCUTANEOUS | 2 refills | Status: DC

## 2023-07-10 DIAGNOSIS — K080 Exfoliation of teeth due to systemic causes: Secondary | ICD-10-CM | POA: Diagnosis not present

## 2023-07-12 ENCOUNTER — Telehealth: Payer: Self-pay

## 2023-07-12 ENCOUNTER — Other Ambulatory Visit (HOSPITAL_COMMUNITY): Payer: Self-pay

## 2023-07-12 NOTE — Telephone Encounter (Signed)
Pharmacy Patient Advocate Encounter   Received notification from Physician's Office that prior authorization for REPATHA is required/requested.   Insurance verification completed.   The patient is insured through  [employer]  .   Per test claim: PA required; PA submitted to [facility] [place] [employer] via CoverMyMeds Key/confirmation #/EOC Key: [id number] Status is pending

## 2023-07-13 ENCOUNTER — Encounter: Payer: Self-pay | Admitting: Internal Medicine

## 2023-07-13 NOTE — Assessment & Plan Note (Signed)
Blood pressure has been doing well on lisinopril 20mg  q day.  Follow pressures.  No changes in medication.  Follow metabolic panel.

## 2023-07-13 NOTE — Assessment & Plan Note (Addendum)
The 10-year ASCVD risk score (Arnett DK, et al., 2019) is: 16.7%   Values used to calculate the score:     Age: 67 years     Sex: Female     Is Non-Hispanic African American: No     Diabetic: Yes     Tobacco smoker: No     Systolic Blood Pressure: 122 mmHg     Is BP treated: Yes     HDL Cholesterol: 63.1 mg/dL     Total Cholesterol: 203 mg/dL  Low carb diet and exercise.  Continue metformin and mounjaro.  Has lost weight.  Follow met b and a1c. Discussed cholesterol treatment.  Intolerant to crestor and zetia. Discussed repatha.  Agreeable to start.  Will hold on starting until current symptoms resolve.   Lab Results  Component Value Date   HGBA1C 5.9 03/14/2023

## 2023-07-13 NOTE — Telephone Encounter (Signed)
Marny Lowenstein from [employer] about a prior authorization for Medicare part D from the provider.

## 2023-07-13 NOTE — Assessment & Plan Note (Signed)
Statin intolerance.  Intolerance to zetia and crestor.  Discussed repatha.

## 2023-07-13 NOTE — Assessment & Plan Note (Signed)
Intolerant to statin (crestor) and zetia.  Has stopped zetia.  Call with update over the next 1-2 weeks.

## 2023-07-16 ENCOUNTER — Other Ambulatory Visit (HOSPITAL_COMMUNITY): Payer: Self-pay

## 2023-07-16 ENCOUNTER — Other Ambulatory Visit: Payer: Self-pay | Admitting: Internal Medicine

## 2023-07-16 ENCOUNTER — Other Ambulatory Visit: Payer: Self-pay

## 2023-07-16 ENCOUNTER — Encounter: Payer: Self-pay | Admitting: Internal Medicine

## 2023-07-16 DIAGNOSIS — M47816 Spondylosis without myelopathy or radiculopathy, lumbar region: Secondary | ICD-10-CM | POA: Diagnosis not present

## 2023-07-16 DIAGNOSIS — M5416 Radiculopathy, lumbar region: Secondary | ICD-10-CM | POA: Diagnosis not present

## 2023-07-16 DIAGNOSIS — M7062 Trochanteric bursitis, left hip: Secondary | ICD-10-CM | POA: Diagnosis not present

## 2023-07-16 DIAGNOSIS — M7061 Trochanteric bursitis, right hip: Secondary | ICD-10-CM | POA: Diagnosis not present

## 2023-07-16 MED ORDER — MOUNJARO 7.5 MG/0.5ML ~~LOC~~ SOAJ
7.5000 mg | SUBCUTANEOUS | 1 refills | Status: DC
  Filled 2023-07-16 – 2023-07-17 (×2): qty 2, 28d supply, fill #0
  Filled 2023-10-08: qty 2, 28d supply, fill #1
  Filled 2023-10-30: qty 2, 28d supply, fill #2
  Filled 2023-11-29: qty 2, 28d supply, fill #3

## 2023-07-16 NOTE — Telephone Encounter (Signed)
PA required for repatha

## 2023-07-16 NOTE — Telephone Encounter (Signed)
Pharmacy Patient Advocate Encounter  Received notification from [facility] that Prior Authorization for REPATHA  has been APPROVED from 9.27.24 to 9.27.25 . Ran test claim, Copay is $133.16.    Please note the pt cost is due to her being in a coverage gap as well as this cost contributing to her coinsurance   This test claim was processed through [facility]- copay amounts may vary at other pharmacies due to pharmacy/plan contracts, or as the patient moves through the different stages of their insurance plan.   PA #/Case ID/Reference #: (Key: [id number])

## 2023-07-17 ENCOUNTER — Other Ambulatory Visit: Payer: Self-pay

## 2023-07-17 ENCOUNTER — Encounter: Payer: Self-pay | Admitting: Internal Medicine

## 2023-07-17 DIAGNOSIS — E119 Type 2 diabetes mellitus without complications: Secondary | ICD-10-CM | POA: Diagnosis not present

## 2023-07-18 NOTE — Telephone Encounter (Signed)
If she is feeling better, I am ok with her starting repatha.  Also, the hot flashes could be coming from the steroids. Let us know if persistent or if any problems.

## 2023-07-19 ENCOUNTER — Telehealth: Payer: Self-pay | Admitting: Internal Medicine

## 2023-07-19 DIAGNOSIS — E1169 Type 2 diabetes mellitus with other specified complication: Secondary | ICD-10-CM

## 2023-07-19 DIAGNOSIS — D649 Anemia, unspecified: Secondary | ICD-10-CM

## 2023-07-19 NOTE — Telephone Encounter (Signed)
Patient need lab orders.

## 2023-07-20 NOTE — Telephone Encounter (Signed)
Lab orders placed.  

## 2023-07-24 ENCOUNTER — Other Ambulatory Visit: Payer: Medicare Other

## 2023-07-24 DIAGNOSIS — D649 Anemia, unspecified: Secondary | ICD-10-CM

## 2023-07-24 DIAGNOSIS — E785 Hyperlipidemia, unspecified: Secondary | ICD-10-CM

## 2023-07-24 DIAGNOSIS — E1169 Type 2 diabetes mellitus with other specified complication: Secondary | ICD-10-CM

## 2023-07-24 LAB — LIPID PANEL
Cholesterol: 232 mg/dL — ABNORMAL HIGH (ref 0–200)
HDL: 70.9 mg/dL (ref >39.00)
LDL Cholesterol: 142 mg/dL — ABNORMAL HIGH (ref 0–99)
NonHDL: 160.77
Total CHOL/HDL Ratio: 3
Triglycerides: 92 mg/dL (ref 0.0–149.0)
VLDL: 18.4 mg/dL (ref 0.0–40.0)

## 2023-07-24 LAB — HEPATIC FUNCTION PANEL
ALT: 18 U/L (ref 0–35)
AST: 15 U/L (ref 0–37)
Albumin: 4.4 g/dL (ref 3.5–5.2)
Alkaline Phosphatase: 45 U/L (ref 39–117)
Bilirubin, Direct: 0.1 mg/dL (ref 0.0–0.3)
Total Bilirubin: 0.5 mg/dL (ref 0.2–1.2)
Total Protein: 6.9 g/dL (ref 6.0–8.3)

## 2023-07-24 LAB — BASIC METABOLIC PANEL
BUN: 21 mg/dL (ref 6–23)
CO2: 30 meq/L (ref 19–32)
Calcium: 9.5 mg/dL (ref 8.4–10.5)
Chloride: 103 meq/L (ref 96–112)
Creatinine, Ser: 0.99 mg/dL (ref 0.40–1.20)
GFR: 58.65 mL/min — ABNORMAL LOW (ref >60.00)
Glucose, Bld: 98 mg/dL (ref 70–99)
Potassium: 4 meq/L (ref 3.5–5.1)
Sodium: 139 meq/L (ref 135–145)

## 2023-07-24 LAB — TSH: TSH: 0.98 u[IU]/mL (ref 0.35–5.50)

## 2023-07-24 LAB — HEMOGLOBIN A1C: Hgb A1c MFr Bld: 5.8 % (ref 4.6–6.5)

## 2023-07-26 ENCOUNTER — Encounter: Payer: Self-pay | Admitting: Internal Medicine

## 2023-07-26 ENCOUNTER — Ambulatory Visit (INDEPENDENT_AMBULATORY_CARE_PROVIDER_SITE_OTHER): Payer: Medicare Other | Admitting: Internal Medicine

## 2023-07-26 VITALS — BP 120/70 | HR 69 | Temp 97.9°F | Resp 16 | Ht 62.0 in | Wt 140.0 lb

## 2023-07-26 DIAGNOSIS — I700 Atherosclerosis of aorta: Secondary | ICD-10-CM

## 2023-07-26 DIAGNOSIS — F419 Anxiety disorder, unspecified: Secondary | ICD-10-CM

## 2023-07-26 DIAGNOSIS — I10 Essential (primary) hypertension: Secondary | ICD-10-CM | POA: Diagnosis not present

## 2023-07-26 DIAGNOSIS — E785 Hyperlipidemia, unspecified: Secondary | ICD-10-CM | POA: Diagnosis not present

## 2023-07-26 DIAGNOSIS — D472 Monoclonal gammopathy: Secondary | ICD-10-CM

## 2023-07-26 DIAGNOSIS — R519 Headache, unspecified: Secondary | ICD-10-CM

## 2023-07-26 DIAGNOSIS — E039 Hypothyroidism, unspecified: Secondary | ICD-10-CM

## 2023-07-26 DIAGNOSIS — F320 Major depressive disorder, single episode, mild: Secondary | ICD-10-CM

## 2023-07-26 DIAGNOSIS — D649 Anemia, unspecified: Secondary | ICD-10-CM

## 2023-07-26 DIAGNOSIS — G720 Drug-induced myopathy: Secondary | ICD-10-CM

## 2023-07-26 DIAGNOSIS — G4733 Obstructive sleep apnea (adult) (pediatric): Secondary | ICD-10-CM

## 2023-07-26 DIAGNOSIS — E1169 Type 2 diabetes mellitus with other specified complication: Secondary | ICD-10-CM | POA: Diagnosis not present

## 2023-07-26 DIAGNOSIS — T466X5A Adverse effect of antihyperlipidemic and antiarteriosclerotic drugs, initial encounter: Secondary | ICD-10-CM

## 2023-07-26 DIAGNOSIS — Z7984 Long term (current) use of oral hypoglycemic drugs: Secondary | ICD-10-CM

## 2023-07-26 LAB — HM DIABETES FOOT EXAM

## 2023-07-26 LAB — CBC WITH DIFFERENTIAL/PLATELET
Basophils Absolute: 0 10*3/uL (ref 0.0–0.1)
Basophils Relative: 0.5 % (ref 0.0–3.0)
Eosinophils Absolute: 0.1 10*3/uL (ref 0.0–0.7)
Eosinophils Relative: 0.8 % (ref 0.0–5.0)
HCT: 41.2 % (ref 36.0–46.0)
Hemoglobin: 13.6 g/dL (ref 12.0–15.0)
Lymphocytes Relative: 16.8 % (ref 12.0–46.0)
Lymphs Abs: 1.1 10*3/uL (ref 0.7–4.0)
MCHC: 32.9 g/dL (ref 30.0–36.0)
MCV: 95.3 fL (ref 78.0–100.0)
Monocytes Absolute: 0.5 10*3/uL (ref 0.1–1.0)
Monocytes Relative: 7.6 % (ref 3.0–12.0)
Neutro Abs: 5.1 10*3/uL (ref 1.4–7.7)
Neutrophils Relative %: 74.3 % (ref 43.0–77.0)
Platelets: 200 10*3/uL (ref 150.0–400.0)
RBC: 4.33 Mil/uL (ref 3.87–5.11)
RDW: 14.9 % (ref 11.5–15.5)
WBC: 6.8 10*3/uL (ref 4.0–10.5)

## 2023-07-26 NOTE — Assessment & Plan Note (Signed)
Noticed headache over the last 3-4 days.  Just started repatha.  Was questioning if possible side effect.  She is having some minimal nasal congestion and chest symptoms.  Notices when weather changes.  Start steroid nasal spray.  Tylenol.  Follow.  Call with update.  Continue repatha.

## 2023-07-26 NOTE — Assessment & Plan Note (Signed)
On zoloft 200mg  alternating with 150mg  every other day. Tolerating.  Sleeping better.  On trazodone.  Sleeping better.

## 2023-07-26 NOTE — Assessment & Plan Note (Signed)
Blood pressure has been doing well on lisinopril 20mg  q day.  Follow pressures.  No changes in medication.  Follow metabolic panel.

## 2023-07-26 NOTE — Assessment & Plan Note (Signed)
On thyroid replacement.  Follow tsh.  

## 2023-07-26 NOTE — Assessment & Plan Note (Signed)
Continue cpap.  

## 2023-07-26 NOTE — Assessment & Plan Note (Signed)
Followed by oncology 

## 2023-07-26 NOTE — Progress Notes (Signed)
Subjective:    Patient ID: Tonya Greer, female    DOB: 1956-06-25, 67 y.o.   MRN: [id number]  Patient here for  Chief Complaint  Patient presents with   Medical Management of Chronic Issues    HPI Here for a scheduled follow up - f/u regarding diabetes, hypercholesterolemia and hypertension.  She is accompanied by her husband.  History obtained from both of them. Just started repatha.  Intolerant to statin medication. She has noticed headache over the last 3-4 days - since she took the medication. She is also having some minimal nasal congestion and some chest symptoms.  Feels related to weather change.  No chest pain or sob.  Continues on mounjaro and metformin. Tolerating.  Back is better.     Past Medical History:  Diagnosis Date   Depression    Diabetes mellitus without complication (HCC)    History of carpal tunnel syndrome    Hyperlipidemia    Hypertension    Hypothyroidism    OSA (obstructive sleep apnea)    Past Surgical History:  Procedure Laterality Date   ABDOMINAL HYSTERECTOMY     ovaries left in place   brreast biopsy     FOOT SURGERY     TONSILLECTOMY AND ADENOIDECTOMY     Family History  Problem Relation Age of Onset   Heart disease Mother    Social History   Socioeconomic History   Marital status: Married    Spouse name: Not on file   Number of children: 2   Years of education: Not on file   Highest education level: Associate degree: occupational, Scientist, product/process development, or vocational program  Occupational History   Not on file  Tobacco Use   Smoking status: Never   Smokeless tobacco: Never  Substance and Sexual Activity   Alcohol use: Not on file   Drug use: Never   Sexual activity: Not on file  Other Topics Concern   Not on file  Social History Narrative   Not on file   Social Determinants of Health   Financial Resource Strain: Low Risk  (03/15/2023)   Overall Financial Resource Strain (CARDIA)    Difficulty of Paying Living Expenses: Not hard at  all  Food Insecurity: No Food Insecurity (03/15/2023)   Hunger Vital Sign    Worried About Running Out of Food in the Last Year: Never true    Ran Out of Food in the Last Year: Never true  Transportation Needs: No Transportation Needs (03/15/2023)   PRAPARE - Administrator, Civil Service (Medical): No    Lack of Transportation (Non-Medical): No  Physical Activity: Insufficiently Active (03/15/2023)   Exercise Vital Sign    Days of Exercise per Week: 3 days    Minutes of Exercise per Session: 30 min  Stress: No Stress Concern Present (03/15/2023)   [employer] of Occupational Health - Occupational Stress Questionnaire    Feeling of Stress : Not at all  Social Connections: Unknown (03/15/2023)   Social Connection and Isolation Panel [NHANES]    Frequency of Communication with Friends and Family: More than three times a week    Frequency of Social Gatherings with Friends and Family: More than three times a week    Attends Religious Services: Patient declined    Database administrator or Organizations: Patient declined    Attends Banker Meetings: Not on file    Marital Status: Married     Review of Systems  Constitutional:  Negative for appetite  change and unexpected weight change.  HENT:  Negative for sinus pressure.        Minimal nasal congestion.   Respiratory:  Negative for chest tightness and shortness of breath.        No increased cough.   Cardiovascular:  Negative for chest pain and palpitations.  Gastrointestinal:  Negative for abdominal pain, diarrhea, nausea and vomiting.  Genitourinary:  Negative for difficulty urinating and dysuria.  Musculoskeletal:  Negative for joint swelling and myalgias.       Back is better.   Skin:  Negative for color change and rash.  Neurological:  Positive for headaches. Negative for dizziness.  Psychiatric/Behavioral:  Negative for agitation and dysphoric mood.        Objective:     BP 120/70   Pulse 69    Temp 97.9 F (36.6 C)   Resp 16   Ht 5\' 2"  (1.575 m)   Wt 140 lb (63.5 kg)   SpO2 98%   BMI 25.61 kg/m  Wt Readings from Last 3 Encounters:  07/26/23 140 lb (63.5 kg)  07/09/23 144 lb 1.6 oz (65.4 kg)  04/24/23 144 lb (65.3 kg)    Physical Exam Vitals reviewed.  Constitutional:      General: She is not in acute distress.    Appearance: Normal appearance.  HENT:     Head: Normocephalic and atraumatic.     Right Ear: External ear normal.     Left Ear: External ear normal.  Eyes:     General: No scleral icterus.       Right eye: No discharge.        Left eye: No discharge.     Conjunctiva/sclera: Conjunctivae normal.  Neck:     Thyroid: No thyromegaly.  Cardiovascular:     Rate and Rhythm: Normal rate and regular rhythm.  Pulmonary:     Effort: No respiratory distress.     Breath sounds: Normal breath sounds. No wheezing.  Abdominal:     General: Bowel sounds are normal.     Palpations: Abdomen is soft.     Tenderness: There is no abdominal tenderness.  Musculoskeletal:        General: No swelling or tenderness.     Cervical back: Neck supple. No tenderness.  Lymphadenopathy:     Cervical: No cervical adenopathy.  Skin:    Findings: No erythema or rash.  Neurological:     Mental Status: She is alert.  Psychiatric:        Mood and Affect: Mood normal.        Behavior: Behavior normal.      Outpatient Encounter Medications as of 07/26/2023  Medication Sig   Ascorbic Acid (VITAMIN C) 1000 MG tablet Take 1,000 mg by mouth daily.   aspirin 81 MG chewable tablet Chew 1 tablet by mouth daily.   Evolocumab (REPATHA SURECLICK) 140 MG/ML SOAJ Inject 140 mg into the skin every 14 (fourteen) days.   fluticasone (FLOVENT HFA) 110 MCG/ACT inhaler Inhale 2 puffs into the lungs in the morning and at bedtime.   levothyroxine (SYNTHROID) 100 MCG tablet TAKE 1 TABLET BY MOUTH EVERY DAY   lisinopril (ZESTRIL) 20 MG tablet TAKE 1 TABLET BY MOUTH EVERY DAY   Magnesium  Hydroxide 600 MG CHEW Chew 600 mg by mouth daily.   metFORMIN (GLUCOPHAGE) 500 MG tablet TAKE 1 TABLET BY MOUTH EVERY DAY   Multiple Vitamins-Minerals (CENTRUM SILVER PO) Take 1 tablet by mouth daily.   sertraline (ZOLOFT) 100 MG  tablet Take 2 tablets (200 mg total) by mouth daily. Take two tablets daily.   tirzepatide (MOUNJARO) 7.5 MG/0.5ML Pen Inject 7.5 mg into the skin once a week.   traZODone (DESYREL) 50 MG tablet TAKE 1 TABLET BY MOUTH EVERY DAY AT BEDTIME AS NEEDED FOR SLEEP   vitamin B-12 (CYANOCOBALAMIN) 1000 MCG tablet Take 5,000 mcg by mouth daily.   [DISCONTINUED] ezetimibe (ZETIA) 10 MG tablet TAKE 1 TABLET BY MOUTH EVERY DAY   No facility-administered encounter medications on file as of 07/26/2023.     Lab Results  Component Value Date   WBC 6.8 07/26/2023   HGB 13.6 07/26/2023   HCT 41.2 07/26/2023   PLT 200.0 07/26/2023   GLUCOSE 98 07/24/2023   CHOL 232 (H) 07/24/2023   TRIG 92.0 07/24/2023   HDL 70.90 07/24/2023   LDLCALC 142 (H) 07/24/2023   ALT 18 07/24/2023   AST 15 07/24/2023   NA 139 07/24/2023   K 4.0 07/24/2023   CL 103 07/24/2023   CREATININE 0.99 07/24/2023   BUN 21 07/24/2023   CO2 30 07/24/2023   TSH 0.98 07/24/2023   HGBA1C 5.8 07/24/2023   MICROALBUR <0.7 08/29/2022    No results found.     Assessment & Plan:  DM type 2 with diabetic dyslipidemia (HCC) Assessment & Plan: The 10-year ASCVD risk score (Arnett DK, et al., 2019) is: 16.5%   Values used to calculate the score:     Age: 20 years     Sex: Female     Is Non-Hispanic African American: No     Diabetic: Yes     Tobacco smoker: No     Systolic Blood Pressure: 120 mmHg     Is BP treated: Yes     HDL Cholesterol: 70.9 mg/dL     Total Cholesterol: 232 mg/dL  Low carb diet and exercise.  Continue metformin and mounjaro.  Has lost weight.  Follow met b and a1c. Discussed cholesterol treatment.  Intolerant to crestor and zetia. On repatha now. Just started.  Lab Results  Component  Value Date   HGBA1C 5.8 07/24/2023    Orders: -     CBC with Differential/Platelet; Future -     Lipid panel; Future -     Hepatic function panel; Future -     Hemoglobin A1c; Future -     Basic metabolic panel; Future -     Microalbumin / creatinine urine ratio; Future -     CBC with Differential/Platelet  Aortic atherosclerosis (HCC) Assessment & Plan: Statin intolerance.  Intolerance to zetia and crestor.  Just started repatha.    Hypertension, unspecified type Assessment & Plan: Blood pressure has been doing well on lisinopril 20mg  q day.  Follow pressures.  No changes in medication.  Follow metabolic panel.    Hypothyroidism, unspecified type Assessment & Plan: On thyroid replacement.  Follow tsh.    Major depressive disorder, single episode, mild (HCC) Assessment & Plan: On zoloft 200mg  alternating with 150mg  every other day. Tolerating.  Sleeping better.  On trazodone.  Sleeping better.    MGUS (monoclonal gammopathy of unknown significance) Assessment & Plan: Followed by oncology.    OSA (obstructive sleep apnea) Assessment & Plan: Continue cpap.    Statin myopathy Assessment & Plan: Intolerant to statin (crestor) and zetia.  On repatha.  Just started.    Nonintractable headache, unspecified chronicity pattern, unspecified headache type Assessment & Plan: Noticed headache over the last 3-4 days.  Just started repatha.  Was  questioning if possible side effect.  She is having some minimal nasal congestion and chest symptoms.  Notices when weather changes.  Start steroid nasal spray.  Tylenol.  Follow.  Call with update.  Continue repatha.    Anemia, unspecified type Assessment & Plan: Recheck cbc today per pt request.    Anxiety Assessment & Plan: On zoloft.  Taking trazodone to help with sleep. Sleeping better.  Follow.       Dale [place], MD

## 2023-07-26 NOTE — Assessment & Plan Note (Signed)
On zoloft.  Taking trazodone to help with sleep. Sleeping better.  Follow.

## 2023-07-26 NOTE — Patient Instructions (Signed)
Saline nasal spray - flush nose at least 1-2x/day  Flonase (or nasacort) nasal spray - 2 sprays each nostril one time per day.  Do this in the evening.

## 2023-07-26 NOTE — Assessment & Plan Note (Signed)
Recheck cbc today per pt request.

## 2023-07-26 NOTE — Assessment & Plan Note (Signed)
The 10-year ASCVD risk score (Arnett DK, et al., 2019) is: 16.5%   Values used to calculate the score:     Age: 67 years     Sex: Female     Is Non-Hispanic African American: No     Diabetic: Yes     Tobacco smoker: No     Systolic Blood Pressure: 120 mmHg     Is BP treated: Yes     HDL Cholesterol: 70.9 mg/dL     Total Cholesterol: 232 mg/dL  Low carb diet and exercise.  Continue metformin and mounjaro.  Has lost weight.  Follow met b and a1c. Discussed cholesterol treatment.  Intolerant to crestor and zetia. On repatha now. Just started.  Lab Results  Component Value Date   HGBA1C 5.8 07/24/2023

## 2023-07-26 NOTE — Assessment & Plan Note (Signed)
Intolerant to statin (crestor) and zetia.  On repatha.  Just started.

## 2023-07-26 NOTE — Assessment & Plan Note (Signed)
Statin intolerance.  Intolerance to zetia and crestor.  Just started repatha.

## 2023-08-01 ENCOUNTER — Encounter: Payer: Self-pay | Admitting: Internal Medicine

## 2023-08-01 MED ORDER — SERTRALINE HCL 100 MG PO TABS: ORAL_TABLET | ORAL | 1 refills | Status: DC

## 2023-08-01 NOTE — Telephone Encounter (Signed)
Rx ok'd for zoloft.

## 2023-08-01 NOTE — Telephone Encounter (Addendum)
This was last filled in February for 180 and 1 refill? Okay to refill? Pt stated that she asked for a refill at her last OV.  Per office note:   On zoloft 200mg  alternating with 150mg  every other day. Tolerating.  Sleeping better.  On trazodone.  Sleeping better.       Refill pended below for approval

## 2023-08-15 DIAGNOSIS — Z853 Personal history of malignant neoplasm of breast: Secondary | ICD-10-CM | POA: Diagnosis not present

## 2023-08-15 DIAGNOSIS — N641 Fat necrosis of breast: Secondary | ICD-10-CM | POA: Diagnosis not present

## 2023-08-15 DIAGNOSIS — C50912 Malignant neoplasm of unspecified site of left female breast: Secondary | ICD-10-CM | POA: Diagnosis not present

## 2023-08-15 DIAGNOSIS — D472 Monoclonal gammopathy: Secondary | ICD-10-CM | POA: Diagnosis not present

## 2023-08-15 DIAGNOSIS — Z08 Encounter for follow-up examination after completed treatment for malignant neoplasm: Secondary | ICD-10-CM | POA: Diagnosis not present

## 2023-08-15 LAB — HM MAMMOGRAPHY

## 2023-08-17 DIAGNOSIS — M25641 Stiffness of right hand, not elsewhere classified: Secondary | ICD-10-CM | POA: Diagnosis not present

## 2023-08-17 DIAGNOSIS — S63652A Sprain of metacarpophalangeal joint of right middle finger, initial encounter: Secondary | ICD-10-CM | POA: Diagnosis not present

## 2023-08-17 DIAGNOSIS — S63659A Sprain of metacarpophalangeal joint of unspecified finger, initial encounter: Secondary | ICD-10-CM | POA: Diagnosis not present

## 2023-08-17 DIAGNOSIS — E119 Type 2 diabetes mellitus without complications: Secondary | ICD-10-CM | POA: Diagnosis not present

## 2023-08-17 DIAGNOSIS — M79641 Pain in right hand: Secondary | ICD-10-CM | POA: Diagnosis not present

## 2023-08-27 DIAGNOSIS — K080 Exfoliation of teeth due to systemic causes: Secondary | ICD-10-CM | POA: Diagnosis not present

## 2023-09-04 ENCOUNTER — Other Ambulatory Visit: Payer: Self-pay | Admitting: Internal Medicine

## 2023-09-16 DIAGNOSIS — E119 Type 2 diabetes mellitus without complications: Secondary | ICD-10-CM | POA: Diagnosis not present

## 2023-09-20 DIAGNOSIS — M65332 Trigger finger, left middle finger: Secondary | ICD-10-CM | POA: Diagnosis not present

## 2023-10-06 ENCOUNTER — Encounter: Payer: Self-pay | Admitting: Internal Medicine

## 2023-10-08 ENCOUNTER — Other Ambulatory Visit: Payer: Self-pay

## 2023-10-08 MED ORDER — FLUTICASONE PROPIONATE HFA 110 MCG/ACT IN AERO: 2.0000 | INHALATION_SPRAY | Freq: Two times a day (BID) | RESPIRATORY_TRACT | 2 refills | Status: DC

## 2023-10-09 ENCOUNTER — Other Ambulatory Visit: Payer: Self-pay

## 2023-10-17 DIAGNOSIS — E119 Type 2 diabetes mellitus without complications: Secondary | ICD-10-CM | POA: Diagnosis not present

## 2023-10-19 ENCOUNTER — Encounter: Payer: Self-pay | Admitting: Internal Medicine

## 2023-10-22 NOTE — Telephone Encounter (Signed)
 Per review of last note (seen 07/26/23), I had recommended a 4 month f/u with fasting labs 1-2 days prior.  (This would mean f/u in 11/2023). I would like to change her April lab and f/u to 11/2022.  Thanks  lab orders are already in.

## 2023-10-22 NOTE — Telephone Encounter (Signed)
 Her last fasting labs were in October. Do you want her to have her cholesterol checked sooner than April?

## 2023-10-23 ENCOUNTER — Telehealth: Payer: Self-pay

## 2023-10-23 NOTE — Telephone Encounter (Signed)
 See mychart.

## 2023-10-23 NOTE — Telephone Encounter (Signed)
 Copied from CRM [id number]. Topic: Clinical - Medical Advice >> Oct 23, 2023 10:10 AM Drema MATSU wrote: Reason for CRM: Patient is requesting a callback about bloodwork that was requested by Dr. Glendia. Patient wants to know if she should wait until her appointment in April or go ahead and schedule now.

## 2023-10-23 NOTE — Telephone Encounter (Signed)
 Called patient to schedule lab and CPE in Feb. Pt was not at home and will call back to schedule. Please schedule her when she returns call.

## 2023-10-29 ENCOUNTER — Other Ambulatory Visit: Payer: Medicare Other

## 2023-11-05 DIAGNOSIS — M65332 Trigger finger, left middle finger: Secondary | ICD-10-CM | POA: Diagnosis not present

## 2023-11-09 ENCOUNTER — Other Ambulatory Visit: Payer: Self-pay | Admitting: Internal Medicine

## 2023-11-12 ENCOUNTER — Encounter: Payer: Self-pay | Admitting: *Deleted

## 2023-11-12 ENCOUNTER — Ambulatory Visit (INDEPENDENT_AMBULATORY_CARE_PROVIDER_SITE_OTHER): Payer: Medicare Other | Admitting: *Deleted

## 2023-11-12 VITALS — Ht 62.0 in | Wt 137.0 lb

## 2023-11-12 DIAGNOSIS — Z0000 Encounter for general adult medical examination without abnormal findings: Secondary | ICD-10-CM

## 2023-11-12 NOTE — Patient Instructions (Signed)
Ms. Burges , Thank you for taking time to come for your Medicare Wellness Visit. I appreciate your ongoing commitment to your health goals. Please review the following plan we discussed and let me know if I can assist you in the future.   Referrals/Orders/Follow-Ups/Clinician Recommendations: Your records have been updated.   This is a list of the screening recommended for you and due dates:  Health Maintenance  Topic Date Due   Hepatitis C Screening  Never done   COVID-19 Vaccine (7 - 2024-25 season) 08/15/2023   Yearly kidney health urinalysis for diabetes  08/30/2023   Hemoglobin A1C  01/22/2024   Eye exam for diabetics  03/14/2024   Yearly kidney function blood test for diabetes  07/23/2024   Complete foot exam   07/25/2024   Mammogram  08/14/2024   Colon Cancer Screening  08/26/2024   Medicare Annual Wellness Visit  11/11/2024   DTaP/Tdap/Td vaccine (2 - Td or Tdap) 05/20/2029   Pneumonia Vaccine  Completed   Flu Shot  Completed   DEXA scan (bone density measurement)  Completed   Zoster (Shingles) Vaccine  Completed   HPV Vaccine  Aged Out    Advanced directives: (Copy Requested) Please bring a copy of your health care power of attorney and living will to the office to be added to your chart at your convenience.  Next Medicare Annual Wellness Visit scheduled for next year: Yes 11/18/24 @ 1:40

## 2023-11-12 NOTE — Progress Notes (Signed)
Subjective:   Tonya Greer is a 68 y.o. female who presents for Medicare Annual (Subsequent) preventive examination.  Visit Complete: Virtual I connected with  Tonya Greer on 11/12/23 by a video and audio enabled telemedicine application and verified that I am speaking with the correct person using two identifiers.  Patient Location: Home  Provider Location: Office/Clinic  I discussed the limitations of evaluation and management by telemedicine. The patient expressed understanding and agreed to proceed.  Vital Signs: Because this visit was a virtual/telehealth visit, some criteria may be missing or patient reported. Any vitals not documented were not able to be obtained and vitals that have been documented are patient reported.  Patient Medicare AWV questionnaire was completed by the patient on 11/12/23; I have confirmed that all information answered by patient is correct and no changes since this date.  Cardiac Risk Factors include: advanced age (>13men, >75 women);diabetes mellitus;hypertension     Objective:    Today's Vitals   11/12/23 1443  Weight: 137 lb (62.1 kg)  Height: 5\' 2"  (1.575 m)   Body mass index is 25.06 kg/m.     11/12/2023    2:50 PM 04/07/2021   12:26 PM  Advanced Directives  Does Patient Have a Medical Advance Directive? Yes Yes  Type of Estate agent of [place];Living will   Does patient want to make changes to medical advance directive?  No - Patient declined  Copy of Healthcare Power of Attorney in Chart? No - copy requested     Current Medications (verified) Outpatient Encounter Medications as of 11/12/2023  Medication Sig   Ascorbic Acid (VITAMIN C) 1000 MG tablet Take 1,000 mg by mouth daily.   aspirin 81 MG chewable tablet Chew 1 tablet by mouth daily.   Evolocumab (REPATHA SURECLICK) 140 MG/ML SOAJ INJECT 140 MG INTO THE SKIN EVERY 14 (FOURTEEN) DAYS.   fluticasone (FLOVENT HFA) 110 MCG/ACT inhaler Inhale 2 puffs into  the lungs in the morning and at bedtime.   levothyroxine (SYNTHROID) 100 MCG tablet TAKE 1 TABLET BY MOUTH EVERY DAY   lisinopril (ZESTRIL) 20 MG tablet TAKE 1 TABLET BY MOUTH EVERY DAY   Magnesium Hydroxide 600 MG CHEW Chew 600 mg by mouth daily.   metFORMIN (GLUCOPHAGE) 500 MG tablet TAKE 1 TABLET BY MOUTH EVERY DAY   Multiple Vitamins-Minerals (CENTRUM SILVER PO) Take 1 tablet by mouth daily.   sertraline (ZOLOFT) 100 MG tablet Take two tablets alternating with 1 1/2 tablet every other day.   tirzepatide (MOUNJARO) 7.5 MG/0.5ML Pen Inject 7.5 mg into the skin once a week.   traZODone (DESYREL) 50 MG tablet TAKE 1 TABLET BY MOUTH AT BEDTIME AS NEEDED FOR SLEEP   vitamin B-12 (CYANOCOBALAMIN) 1000 MCG tablet Take 5,000 mcg by mouth daily.   No facility-administered encounter medications on file as of 11/12/2023.    Allergies (verified) Hydrocodone and Oxycodone   History: Past Medical History:  Diagnosis Date   Depression    Diabetes mellitus without complication (HCC)    History of carpal tunnel syndrome    Hyperlipidemia    Hypertension    Hypothyroidism    OSA (obstructive sleep apnea)    Past Surgical History:  Procedure Laterality Date   ABDOMINAL HYSTERECTOMY     ovaries left in place   brreast biopsy     FOOT SURGERY     TONSILLECTOMY AND ADENOIDECTOMY     Family History  Problem Relation Age of Onset   Heart disease Mother  Social History   Socioeconomic History   Marital status: Married    Spouse name: Not on file   Number of children: 2   Years of education: Not on file   Highest education level: Associate degree: occupational, Scientist, product/process development, or vocational program  Occupational History   Not on file  Tobacco Use   Smoking status: Never   Smokeless tobacco: Never  Substance and Sexual Activity   Alcohol use: Not on file   Drug use: Never   Sexual activity: Not on file  Other Topics Concern   Not on file  Social History Narrative   Not on file    Social Drivers of Health   Financial Resource Strain: Low Risk  (11/12/2023)   Overall Financial Resource Strain (CARDIA)    Difficulty of Paying Living Expenses: Not hard at all  Food Insecurity: No Food Insecurity (11/12/2023)   Hunger Vital Sign    Worried About Running Out of Food in the Last Year: Never true    Ran Out of Food in the Last Year: Never true  Transportation Needs: No Transportation Needs (11/12/2023)   PRAPARE - Administrator, Civil Service (Medical): No    Lack of Transportation (Non-Medical): No  Physical Activity: Insufficiently Active (11/12/2023)   Exercise Vital Sign    Days of Exercise per Week: 3 days    Minutes of Exercise per Session: 20 min  Stress: No Stress Concern Present (11/12/2023)   [employer] of Occupational Health - Occupational Stress Questionnaire    Feeling of Stress : Not at all  Social Connections: Moderately Integrated (11/12/2023)   Social Connection and Isolation Panel [NHANES]    Frequency of Communication with Friends and Family: More than three times a week    Frequency of Social Gatherings with Friends and Family: More than three times a week    Attends Religious Services: 1 to 4 times per year    Active Member of [employer] or Organizations: No    Attends Engineer, structural: Not on file    Marital Status: Married    Tobacco Counseling Counseling given: Not Answered   Clinical Intake:  Pre-visit preparation completed: Yes  Pain : No/denies pain     BMI - recorded: 25.06 Nutritional Status: BMI 25 -29 Overweight Nutritional Risks: None Diabetes: Yes CBG done?: No Did pt. bring in CBG monitor from home?: No  How often do you need to have someone help you when you read instructions, pamphlets, or other written materials from your doctor or pharmacy?: 1 - Never  Interpreter Needed?: No  Information entered by :: R. Emiliana Blaize LPN   Activities of Daily Living    11/12/2023    1:21 PM  In your  present state of health, do you have any difficulty performing the following activities:  Hearing? 0  Vision? 0  Difficulty concentrating or making decisions? 0  Walking or climbing stairs? 0  Dressing or bathing? 0  Doing errands, shopping? 0  Preparing Food and eating ? N  Using the Toilet? N  In the past six months, have you accidently leaked urine? N  Do you have problems with loss of bowel control? N  Managing your Medications? N  Managing your Finances? N  Housekeeping or managing your Housekeeping? N    Patient Care Team: Dale Blythedale, MD as PCP - General (Internal Medicine)  Indicate any recent Medical Services you may have received from other than [facility] providers in the past year (date may be  approximate).     Assessment:   This is a routine wellness examination for Jenisse.  Hearing/Vision screen Hearing Screening - Comments:: No issues Vision Screening - Comments:: glasses   Goals Addressed             This Visit's Progress    Patient Stated       Wants to continue to exercise       Depression Screen    11/12/2023    2:47 PM 07/09/2023    4:06 PM 03/15/2023    4:19 PM 11/29/2022    7:44 AM 08/29/2022    9:38 AM 04/17/2022   12:27 PM 09/22/2021    9:04 AM  PHQ 2/9 Scores  PHQ - 2 Score 0 0 0 0 0 0 0  PHQ- 9 Score 0 0 0 2 1      Fall Risk    11/12/2023    1:21 PM 07/09/2023    4:08 PM 11/29/2022    7:44 AM 08/29/2022    9:38 AM 09/22/2021    9:04 AM  Fall Risk   Falls in the past year? 0 0 0 0 0  Number falls in past yr: 0 0 0 0 0  Injury with Fall? 0 0 0 0 0  Risk for fall due to : No Fall Risks No Fall Risks No Fall Risks No Fall Risks No Fall Risks  Follow up Falls prevention discussed;Falls evaluation completed Falls evaluation completed Falls evaluation completed Falls evaluation completed Falls evaluation completed    MEDICARE RISK AT HOME: Medicare Risk at Home Any stairs in or around the home?: (Patient-Rptd) Yes If so, are there any  without handrails?: (Patient-Rptd) No Home free of loose throw rugs in walkways, pet beds, electrical cords, etc?: (Patient-Rptd) Yes Adequate lighting in your home to reduce risk of falls?: (Patient-Rptd) Yes Life alert?: (Patient-Rptd) No Use of a cane, walker or w/c?: (Patient-Rptd) No Grab bars in the bathroom?: (Patient-Rptd) No Shower chair or bench in shower?: (Patient-Rptd) No Elevated toilet seat or a handicapped toilet?: (Patient-Rptd) No Cognitive Function:        11/12/2023    2:50 PM  6CIT Screen  What Year? 0 points  What month? 0 points  What time? 0 points  Count back from 20 0 points  Months in reverse 0 points  Repeat phrase 0 points  Total Score 0 points    Immunizations Immunization History  Administered Date(s) Administered   Fluad Quad(high Dose 65+) 07/30/2020, 06/17/2022   Influenza Split 07/29/2014, 08/17/2015   Influenza, High Dose Seasonal PF 07/20/2021   Influenza, Quadrivalent, Recombinant, Inj, Pf 07/04/2019   Influenza, Seasonal, Injecte, Preservative Fre 07/16/2017, 06/18/2018   Influenza-Unspecified 07/16/2017, 06/18/2018   Moderna Covid-19 Fall Seasonal Vaccine 28yrs & older 07/17/2022   Moderna Sars-Covid-2 Vaccination 11/11/2019, 12/10/2019, 05/31/2020   PNEUMOCOCCAL CONJUGATE-20 10/18/2022   Pfizer Covid-19 Vaccine Bivalent Booster 23yrs & up 07/28/2021   Pneumococcal Conjugate-13 11/19/2014   Pneumococcal Polysaccharide-23 12/25/2018   Respiratory Syncytial Virus Vaccine,Recomb Aduvanted(Arexvy) 10/18/2022   Tdap 05/21/2019   Zoster Recombinant(Shingrix) 06/18/2018, 08/18/2018    TDAP status: Up to date  Flu Vaccine status: Up to date  Pneumococcal vaccine status: Up to date  Covid-19 vaccine status: Completed vaccines  Qualifies for Shingles Vaccine? Yes   Zostavax completed  No Shingrix Completed?: Yes  Screening Tests Health Maintenance  Topic Date Due   Hepatitis C Screening  Never done   DEXA SCAN  Never done    Medicare Annual Wellness (AWV)  04/18/2023   COVID-19 Vaccine (6 - 2024-25 season) 06/17/2023   MAMMOGRAM  08/15/2023   Diabetic kidney evaluation - Urine ACR  08/30/2023   INFLUENZA VACCINE  03/14/2024 (Originally 05/17/2023)   HEMOGLOBIN A1C  01/22/2024   OPHTHALMOLOGY EXAM  03/14/2024   Diabetic kidney evaluation - eGFR measurement  07/23/2024   FOOT EXAM  07/25/2024   DTaP/Tdap/Td (2 - Td or Tdap) 05/20/2029   Colonoscopy  08/26/2029   Pneumonia Vaccine 44+ Years old  Completed   Zoster Vaccines- Shingrix  Completed   HPV VACCINES  Aged Out    Health Maintenance  Health Maintenance Due  Topic Date Due   Hepatitis C Screening  Never done   DEXA SCAN  Never done   Medicare Annual Wellness (AWV)  04/18/2023   COVID-19 Vaccine (6 - 2024-25 season) 06/17/2023   MAMMOGRAM  08/15/2023   Diabetic kidney evaluation - Urine ACR  08/30/2023    Colorectal cancer screening: Type of screening: Colonoscopy. Completed 6//2022. Repeat every 5 years  Mammogram status: Completed 07/2023. Repeat every year  Bone Density status: Completed 01/2023. Results reflect: Bone density results: OSTEOPENIA. Repeat every 2 years.  Lung Cancer Screening: (Low Dose CT Chest recommended if Age 35-80 years, 20 pack-year currently smoking OR have quit w/in 15years.) does not qualify.     Additional Screening:  Hepatitis C Screening: does qualify; Completed See note patient declined , will discuss with PCP at next visit  Vision Screening: Recommended annual ophthalmology exams for early detection of glaucoma and other disorders of the eye. Is the patient up to date with their annual eye exam?  Yes  Who is the provider or what is the name of the office in which the patient attends annual eye exams? [facility] If pt is not established with a provider, would they like to be referred to a provider to establish care? No .   Dental Screening: Recommended annual dental exams for proper oral hygiene  Diabetic Foot  Exam: Diabetic Foot Exam: Completed 07/2023  Community Resource Referral / Chronic Care Management: CRR required this visit?  No   CCM required this visit?  No     Plan:     I have personally reviewed and noted the following in the patient's chart:   Medical and social history Use of alcohol, tobacco or illicit drugs  Current medications and supplements including opioid prescriptions. Patient is not currently taking opioid prescriptions. Functional ability and status Nutritional status Physical activity Advanced directives List of other physicians Hospitalizations, surgeries, and ER visits in previous 12 months Vitals Screenings to include cognitive, depression, and falls Referrals and appointments  In addition, I have reviewed and discussed with patient certain preventive protocols, quality metrics, and best practice recommendations. A written personalized care plan for preventive services as well as general preventive health recommendations were provided to patient.     Sydell Axon, LPN   9/60/4540   After Visit Summary: (MyChart) Due to this being a telephonic visit, the after visit summary with patients personalized plan was offered to patient via MyChart   Nurse Notes: None

## 2023-11-17 DIAGNOSIS — E119 Type 2 diabetes mellitus without complications: Secondary | ICD-10-CM | POA: Diagnosis not present

## 2023-11-26 ENCOUNTER — Other Ambulatory Visit: Payer: Medicare Other

## 2023-11-29 ENCOUNTER — Other Ambulatory Visit: Payer: Self-pay

## 2023-11-30 ENCOUNTER — Other Ambulatory Visit: Payer: Self-pay | Admitting: Internal Medicine

## 2023-12-02 ENCOUNTER — Telehealth: Payer: Medicare Other | Admitting: Nurse Practitioner

## 2023-12-02 DIAGNOSIS — J019 Acute sinusitis, unspecified: Secondary | ICD-10-CM | POA: Diagnosis not present

## 2023-12-02 DIAGNOSIS — B9689 Other specified bacterial agents as the cause of diseases classified elsewhere: Secondary | ICD-10-CM | POA: Diagnosis not present

## 2023-12-02 MED ORDER — AMOXICILLIN-POT CLAVULANATE 875-125 MG PO TABS: 1.0000 | ORAL_TABLET | Freq: Two times a day (BID) | ORAL | 0 refills | Status: AC

## 2023-12-02 NOTE — Progress Notes (Signed)

## 2023-12-02 NOTE — Progress Notes (Signed)
 I have spent 5 minutes in review of e-visit questionnaire, review and updating patient chart, medical decision making and response to patient.   Claiborne Rigg, NP

## 2023-12-14 ENCOUNTER — Other Ambulatory Visit (INDEPENDENT_AMBULATORY_CARE_PROVIDER_SITE_OTHER): Payer: Medicare Other

## 2023-12-14 DIAGNOSIS — E785 Hyperlipidemia, unspecified: Secondary | ICD-10-CM

## 2023-12-14 DIAGNOSIS — E1169 Type 2 diabetes mellitus with other specified complication: Secondary | ICD-10-CM

## 2023-12-14 LAB — HEPATIC FUNCTION PANEL
ALT: 13 U/L (ref 0–35)
AST: 16 U/L (ref 0–37)
Albumin: 4 g/dL (ref 3.5–5.2)
Alkaline Phosphatase: 49 U/L (ref 39–117)
Bilirubin, Direct: 0.1 mg/dL (ref 0.0–0.3)
Total Bilirubin: 0.4 mg/dL (ref 0.2–1.2)
Total Protein: 6.6 g/dL (ref 6.0–8.3)

## 2023-12-14 LAB — CBC WITH DIFFERENTIAL/PLATELET
Basophils Absolute: 0.1 10*3/uL (ref 0.0–0.1)
Basophils Relative: 1 % (ref 0.0–3.0)
Eosinophils Absolute: 0.1 10*3/uL (ref 0.0–0.7)
Eosinophils Relative: 2.3 % (ref 0.0–5.0)
HCT: 37.8 % (ref 36.0–46.0)
Hemoglobin: 12.6 g/dL (ref 12.0–15.0)
Lymphocytes Relative: 21.1 % (ref 12.0–46.0)
Lymphs Abs: 1.1 10*3/uL (ref 0.7–4.0)
MCHC: 33.3 g/dL (ref 30.0–36.0)
MCV: 94.8 fl (ref 78.0–100.0)
Monocytes Absolute: 0.4 10*3/uL (ref 0.1–1.0)
Monocytes Relative: 6.9 % (ref 3.0–12.0)
Neutro Abs: 3.7 10*3/uL (ref 1.4–7.7)
Neutrophils Relative %: 68.7 % (ref 43.0–77.0)
Platelets: 222 10*3/uL (ref 150.0–400.0)
RBC: 3.99 Mil/uL (ref 3.87–5.11)
RDW: 14.3 % (ref 11.5–15.5)
WBC: 5.4 10*3/uL (ref 4.0–10.5)

## 2023-12-14 LAB — BASIC METABOLIC PANEL
BUN: 16 mg/dL (ref 6–23)
CO2: 28 meq/L (ref 19–32)
Calcium: 8.8 mg/dL (ref 8.4–10.5)
Chloride: 102 meq/L (ref 96–112)
Creatinine, Ser: 1.04 mg/dL (ref 0.40–1.20)
GFR: 55.13 mL/min — ABNORMAL LOW (ref >60.00)
Glucose, Bld: 88 mg/dL (ref 70–99)
Potassium: 3.7 meq/L (ref 3.5–5.1)
Sodium: 139 meq/L (ref 135–145)

## 2023-12-14 LAB — LIPID PANEL
Cholesterol: 141 mg/dL (ref 0–200)
HDL: 58.6 mg/dL (ref >39.00)
LDL Cholesterol: 65 mg/dL (ref 0–99)
NonHDL: 82.29
Total CHOL/HDL Ratio: 2
Triglycerides: 88 mg/dL (ref 0.0–149.0)
VLDL: 17.6 mg/dL (ref 0.0–40.0)

## 2023-12-14 LAB — MICROALBUMIN / CREATININE URINE RATIO
Creatinine,U: 255.1 mg/dL
Microalb Creat Ratio: 4.2 mg/g (ref 0.0–30.0)
Microalb, Ur: 1.1 mg/dL (ref 0.0–1.9)

## 2023-12-14 LAB — HEMOGLOBIN A1C: Hgb A1c MFr Bld: 5.7 % (ref 4.6–6.5)

## 2023-12-15 DIAGNOSIS — E119 Type 2 diabetes mellitus without complications: Secondary | ICD-10-CM | POA: Diagnosis not present

## 2023-12-21 ENCOUNTER — Ambulatory Visit: Payer: Medicare Other | Admitting: Internal Medicine

## 2023-12-21 ENCOUNTER — Encounter: Payer: Self-pay | Admitting: Internal Medicine

## 2023-12-21 VITALS — BP 110/72 | HR 79 | Temp 98.0°F | Resp 16 | Ht 62.0 in | Wt 137.8 lb

## 2023-12-21 DIAGNOSIS — E785 Hyperlipidemia, unspecified: Secondary | ICD-10-CM

## 2023-12-21 DIAGNOSIS — E039 Hypothyroidism, unspecified: Secondary | ICD-10-CM

## 2023-12-21 DIAGNOSIS — I10 Essential (primary) hypertension: Secondary | ICD-10-CM | POA: Diagnosis not present

## 2023-12-21 DIAGNOSIS — T466X5A Adverse effect of antihyperlipidemic and antiarteriosclerotic drugs, initial encounter: Secondary | ICD-10-CM

## 2023-12-21 DIAGNOSIS — D472 Monoclonal gammopathy: Secondary | ICD-10-CM

## 2023-12-21 DIAGNOSIS — D649 Anemia, unspecified: Secondary | ICD-10-CM | POA: Diagnosis not present

## 2023-12-21 DIAGNOSIS — I700 Atherosclerosis of aorta: Secondary | ICD-10-CM

## 2023-12-21 DIAGNOSIS — E1169 Type 2 diabetes mellitus with other specified complication: Secondary | ICD-10-CM | POA: Diagnosis not present

## 2023-12-21 DIAGNOSIS — F419 Anxiety disorder, unspecified: Secondary | ICD-10-CM

## 2023-12-21 DIAGNOSIS — G4733 Obstructive sleep apnea (adult) (pediatric): Secondary | ICD-10-CM

## 2023-12-21 DIAGNOSIS — G720 Drug-induced myopathy: Secondary | ICD-10-CM

## 2023-12-21 DIAGNOSIS — F320 Major depressive disorder, single episode, mild: Secondary | ICD-10-CM

## 2023-12-21 MED ORDER — LISINOPRIL 20 MG PO TABS: 20.0000 mg | ORAL_TABLET | Freq: Every day | ORAL | 3 refills | Status: DC

## 2023-12-21 MED ORDER — REPATHA SURECLICK 140 MG/ML ~~LOC~~ SOAJ: 140.0000 mg | SUBCUTANEOUS | 3 refills | Status: DC

## 2023-12-21 MED ORDER — LEVOTHYROXINE SODIUM 100 MCG PO TABS: 100.0000 ug | ORAL_TABLET | Freq: Every day | ORAL | 3 refills | Status: DC

## 2023-12-21 MED ORDER — MOUNJARO 7.5 MG/0.5ML ~~LOC~~ SOAJ: 7.5000 mg | SUBCUTANEOUS | 3 refills | Status: DC

## 2023-12-21 MED ORDER — METFORMIN HCL 500 MG PO TABS: 500.0000 mg | ORAL_TABLET | Freq: Every day | ORAL | 3 refills | Status: DC

## 2023-12-21 MED ORDER — TRAZODONE HCL 50 MG PO TABS: 50.0000 mg | ORAL_TABLET | Freq: Every evening | ORAL | 3 refills | Status: DC | PRN

## 2023-12-21 MED ORDER — SERTRALINE HCL 100 MG PO TABS: ORAL_TABLET | ORAL | 3 refills | Status: DC

## 2023-12-21 NOTE — Assessment & Plan Note (Signed)
 Continue zoloft.  Trazodone - sleep. Overall appears to be doing well.

## 2023-12-21 NOTE — Assessment & Plan Note (Signed)
 Intolerant to statin medication and zetia.  Now on repatha. Tolerating.

## 2023-12-21 NOTE — Assessment & Plan Note (Signed)
 Statin intolerance.  Intolerance to zetia and crestor.  On repatha. Doing well.

## 2023-12-21 NOTE — Progress Notes (Signed)
 Subjective:    Patient ID: Tonya Greer, female    DOB: 1955-11-07, 68 y.o.   MRN: [id number]  Patient here for  Chief Complaint  Patient presents with   Medical Management of Chronic Issues    Follow up on Repatha    HPI Here for a scheduled follow up - f/u regarding diabetes, hypercholesterolemia and hypertension.  She is accompanied by her husband.  History obtained from both of them. She reports she is feeling good. State she feels better than she has in a long time. Stays active. No chest pain or sob reported. No increased cough or congestion. She does report some constipation. Taking miralax prn. She is eating. Does not eat large amounts. Always eats one good meal per day and then some protein snacks. No nausea or vomiting. Discussed lab results   Past Medical History:  Diagnosis Date   Depression    Diabetes mellitus without complication (HCC)    History of carpal tunnel syndrome    Hyperlipidemia    Hypertension    Hypothyroidism    OSA (obstructive sleep apnea)    Past Surgical History:  Procedure Laterality Date   ABDOMINAL HYSTERECTOMY     ovaries left in place   brreast biopsy     FOOT SURGERY     TONSILLECTOMY AND ADENOIDECTOMY     Family History  Problem Relation Age of Onset   Heart disease Mother    Social History   Socioeconomic History   Marital status: Married    Spouse name: Not on file   Number of children: 2   Years of education: Not on file   Highest education level: Associate degree: occupational, Scientist, product/process development, or vocational program  Occupational History   Not on file  Tobacco Use   Smoking status: Never   Smokeless tobacco: Never  Substance and Sexual Activity   Alcohol use: Not on file   Drug use: Never   Sexual activity: Not on file  Other Topics Concern   Not on file  Social History Narrative   Not on file   Social Drivers of Health   Financial Resource Strain: Low Risk  (11/12/2023)   Overall Financial Resource Strain (CARDIA)     Difficulty of Paying Living Expenses: Not hard at all  Food Insecurity: No Food Insecurity (11/12/2023)   Hunger Vital Sign    Worried About Running Out of Food in the Last Year: Never true    Ran Out of Food in the Last Year: Never true  Transportation Needs: No Transportation Needs (11/12/2023)   PRAPARE - Administrator, Civil Service (Medical): No    Lack of Transportation (Non-Medical): No  Physical Activity: Insufficiently Active (11/12/2023)   Exercise Vital Sign    Days of Exercise per Week: 3 days    Minutes of Exercise per Session: 20 min  Stress: No Stress Concern Present (11/12/2023)   [employer] of Occupational Health - Occupational Stress Questionnaire    Feeling of Stress : Not at all  Social Connections: Moderately Integrated (11/12/2023)   Social Connection and Isolation Panel [NHANES]    Frequency of Communication with Friends and Family: More than three times a week    Frequency of Social Gatherings with Friends and Family: More than three times a week    Attends Religious Services: 1 to 4 times per year    Active Member of [employer] or Organizations: No    Attends Banker Meetings: Not on file  Marital Status: Married     Review of Systems  Constitutional:        Appetite as outlined. Weight stable.   HENT:  Negative for congestion and sinus pressure.   Respiratory:  Negative for cough, chest tightness and shortness of breath.   Cardiovascular:  Negative for chest pain, palpitations and leg swelling.  Gastrointestinal:  Positive for constipation. Negative for abdominal pain, nausea and vomiting.  Genitourinary:  Negative for difficulty urinating and dysuria.  Musculoskeletal:  Negative for joint swelling and myalgias.  Skin:  Negative for color change and rash.  Neurological:  Negative for dizziness and headaches.  Psychiatric/Behavioral:  Negative for agitation and dysphoric mood.        Objective:     BP 110/72   Pulse 79    Temp 98 F (36.7 C)   Resp 16   Ht 5\' 2"  (1.575 m)   Wt 137 lb 12.8 oz (62.5 kg)   SpO2 98%   BMI 25.20 kg/m  Wt Readings from Last 3 Encounters:  12/21/23 137 lb 12.8 oz (62.5 kg)  11/12/23 137 lb (62.1 kg)  07/26/23 140 lb (63.5 kg)    Physical Exam Vitals reviewed.  Constitutional:      General: She is not in acute distress.    Appearance: Normal appearance.  HENT:     Head: Normocephalic and atraumatic.     Right Ear: External ear normal.     Left Ear: External ear normal.     Mouth/Throat:     Pharynx: No oropharyngeal exudate or posterior oropharyngeal erythema.  Eyes:     General: No scleral icterus.       Right eye: No discharge.        Left eye: No discharge.     Conjunctiva/sclera: Conjunctivae normal.  Neck:     Thyroid: No thyromegaly.  Cardiovascular:     Rate and Rhythm: Normal rate and regular rhythm.  Pulmonary:     Effort: No respiratory distress.     Breath sounds: Normal breath sounds. No wheezing.  Abdominal:     General: Bowel sounds are normal.     Palpations: Abdomen is soft.     Tenderness: There is no abdominal tenderness.  Musculoskeletal:        General: No swelling or tenderness.     Cervical back: Neck supple. No tenderness.  Lymphadenopathy:     Cervical: No cervical adenopathy.  Skin:    Findings: No erythema or rash.  Neurological:     Mental Status: She is alert.  Psychiatric:        Mood and Affect: Mood normal.        Behavior: Behavior normal.         Outpatient Encounter Medications as of 12/21/2023  Medication Sig   Ascorbic Acid (VITAMIN C) 1000 MG tablet Take 1,000 mg by mouth daily.   aspirin 81 MG chewable tablet Chew 1 tablet by mouth daily.   Evolocumab (REPATHA SURECLICK) 140 MG/ML SOAJ Inject 140 mg into the skin every 14 (fourteen) days.   fluticasone (FLOVENT HFA) 110 MCG/ACT inhaler Inhale 2 puffs into the lungs in the morning and at bedtime.   levothyroxine (SYNTHROID) 100 MCG tablet Take 1 tablet (100  mcg total) by mouth daily.   lisinopril (ZESTRIL) 20 MG tablet Take 1 tablet (20 mg total) by mouth daily.   Magnesium Hydroxide 600 MG CHEW Chew 600 mg by mouth daily.   metFORMIN (GLUCOPHAGE) 500 MG tablet Take 1 tablet (500  mg total) by mouth daily.   Multiple Vitamins-Minerals (CENTRUM SILVER PO) Take 1 tablet by mouth daily.   sertraline (ZOLOFT) 100 MG tablet Take two tablets alternating with 1 1/2 tablet every other day.   tirzepatide (MOUNJARO) 7.5 MG/0.5ML Pen Inject 7.5 mg into the skin once a week.   traZODone (DESYREL) 50 MG tablet Take 1 tablet (50 mg total) by mouth at bedtime as needed. for sleep   vitamin B-12 (CYANOCOBALAMIN) 1000 MCG tablet Take 5,000 mcg by mouth daily.   [DISCONTINUED] Evolocumab (REPATHA SURECLICK) 140 MG/ML SOAJ INJECT 140 MG INTO THE SKIN EVERY 14 (FOURTEEN) DAYS.   [DISCONTINUED] levothyroxine (SYNTHROID) 100 MCG tablet TAKE 1 TABLET BY MOUTH EVERY DAY   [DISCONTINUED] lisinopril (ZESTRIL) 20 MG tablet TAKE 1 TABLET BY MOUTH EVERY DAY   [DISCONTINUED] metFORMIN (GLUCOPHAGE) 500 MG tablet TAKE 1 TABLET BY MOUTH EVERY DAY   [DISCONTINUED] sertraline (ZOLOFT) 100 MG tablet Take two tablets alternating with 1 1/2 tablet every other day.   [DISCONTINUED] tirzepatide (MOUNJARO) 7.5 MG/0.5ML Pen Inject 7.5 mg into the skin once a week.   [DISCONTINUED] traZODone (DESYREL) 50 MG tablet TAKE 1 TABLET BY MOUTH AT BEDTIME AS NEEDED FOR SLEEP   No facility-administered encounter medications on file as of 12/21/2023.     Lab Results  Component Value Date   WBC 5.4 12/14/2023   HGB 12.6 12/14/2023   HCT 37.8 12/14/2023   PLT 222.0 12/14/2023   GLUCOSE 88 12/14/2023   CHOL 141 12/14/2023   TRIG 88.0 12/14/2023   HDL 58.60 12/14/2023   LDLCALC 65 12/14/2023   ALT 13 12/14/2023   AST 16 12/14/2023   NA 139 12/14/2023   K 3.7 12/14/2023   CL 102 12/14/2023   CREATININE 1.04 12/14/2023   BUN 16 12/14/2023   CO2 28 12/14/2023   TSH 0.98 07/24/2023   HGBA1C  5.7 12/14/2023   MICROALBUR 1.1 12/14/2023    No results found.     Assessment & Plan:  Anemia, unspecified type Assessment & Plan: Hgb on recent check wnl.    DM type 2 with diabetic dyslipidemia (HCC) Assessment & Plan: Continue diet and exercise. A1c just checked 5.7. follow.    Orders: -     Lipid panel; Future -     Hepatic function panel; Future -     Hemoglobin A1c; Future -     Basic metabolic panel; Future  Anxiety Assessment & Plan: Continue zoloft.  Trazodone - sleep. Overall appears to be doing well.    Aortic atherosclerosis (HCC) Assessment & Plan: Statin intolerance.  Intolerance to zetia and crestor.  On repatha. Doing well.    Hypertension, unspecified type Assessment & Plan: Blood pressure doing well on lisinopril 20mg  q day. Follow pressures.  Recent GFR 55. Stable. Follow. No changes in medication.    Hypothyroidism, unspecified type Assessment & Plan: On thyroid replacement. Follow tsh.    Major depressive disorder, single episode, mild (HCC) Assessment & Plan: Continues on zoloft. Also taking trazodone. Follow.  Overall doing well.    MGUS (monoclonal gammopathy of unknown significance) Assessment & Plan: Being followed by oncology.    OSA (obstructive sleep apnea) Assessment & Plan: CPAP.    Statin myopathy Assessment & Plan: Intolerant to statin medication and zetia.  Now on repatha. Tolerating.    Other orders -     Repatha SureClick; Inject 140 mg into the skin every 14 (fourteen) days.  Dispense: 6 mL; Refill: 3 -     Levothyroxine  Sodium; Take 1 tablet (100 mcg total) by mouth daily.  Dispense: 90 tablet; Refill: 3 -     Lisinopril; Take 1 tablet (20 mg total) by mouth daily.  Dispense: 90 tablet; Refill: 3 -     metFORMIN HCl; Take 1 tablet (500 mg total) by mouth daily.  Dispense: 90 tablet; Refill: 3 -     Sertraline HCl; Take two tablets alternating with 1 1/2 tablet every other day.  Dispense: 135 tablet; Refill:  3 -     traZODone HCl; Take 1 tablet (50 mg total) by mouth at bedtime as needed. for sleep  Dispense: 90 tablet; Refill: 3 -     Mounjaro; Inject 7.5 mg into the skin once a week.  Dispense: 6 mL; Refill: 3     Dale , MD

## 2023-12-21 NOTE — Assessment & Plan Note (Signed)
 Continue diet and exercise. A1c just checked 5.7. follow.

## 2023-12-21 NOTE — Assessment & Plan Note (Signed)
 CPAP.

## 2023-12-21 NOTE — Assessment & Plan Note (Signed)
 Blood pressure doing well on lisinopril 20mg  q day. Follow pressures.  Recent GFR 55. Stable. Follow. No changes in medication.

## 2023-12-21 NOTE — Assessment & Plan Note (Signed)
 On thyroid replacement.  Follow tsh.

## 2023-12-21 NOTE — Assessment & Plan Note (Signed)
Being followed by oncology. ?

## 2023-12-21 NOTE — Assessment & Plan Note (Signed)
 Continues on zoloft. Also taking trazodone. Follow.  Overall doing well.

## 2023-12-21 NOTE — Assessment & Plan Note (Signed)
 Hgb on recent check wnl.

## 2023-12-24 ENCOUNTER — Encounter: Payer: Self-pay | Admitting: Internal Medicine

## 2023-12-24 MED ORDER — SERTRALINE HCL 100 MG PO TABS: ORAL_TABLET | ORAL | 3 refills | Status: DC

## 2024-01-01 ENCOUNTER — Encounter: Payer: Self-pay | Admitting: Internal Medicine

## 2024-01-07 DIAGNOSIS — M47816 Spondylosis without myelopathy or radiculopathy, lumbar region: Secondary | ICD-10-CM | POA: Diagnosis not present

## 2024-01-07 DIAGNOSIS — M7062 Trochanteric bursitis, left hip: Secondary | ICD-10-CM | POA: Diagnosis not present

## 2024-01-12 ENCOUNTER — Other Ambulatory Visit: Payer: Self-pay | Admitting: Internal Medicine

## 2024-01-15 DIAGNOSIS — E119 Type 2 diabetes mellitus without complications: Secondary | ICD-10-CM | POA: Diagnosis not present

## 2024-01-22 ENCOUNTER — Other Ambulatory Visit: Payer: Medicare Other

## 2024-01-24 ENCOUNTER — Ambulatory Visit: Payer: Medicare Other | Admitting: Internal Medicine

## 2024-01-24 ENCOUNTER — Other Ambulatory Visit: Payer: Self-pay | Admitting: Internal Medicine

## 2024-01-24 NOTE — Telephone Encounter (Signed)
 Copied from CRM [id number]. Topic: General - Other >> Jan 24, 2024  3:07 PM Melissa C wrote: Reason for CRM: patient stated she received a message from office but did not catch the name in the message. I don't see any notations yet regarding the phone call to relay to patient so I let patient know I would have office give patient a call back. Thank you.

## 2024-01-24 NOTE — Telephone Encounter (Signed)
 Left message for patient to give our office a call back to discuss Dr Roby Lofts recommendations.  OK for E2C2 to relay message if patient calls back. If relayed, please notify the office.

## 2024-01-24 NOTE — Telephone Encounter (Signed)
 Spoke with patient and says she is currently taking 1.5 tablets every other day and then 2 tablets other day. Patient says she is OK with taking 1.5 tablet every day. Please advise?

## 2024-01-24 NOTE — Telephone Encounter (Signed)
 I do not mind refilling zoloft, but please call her and see if she would be agreeable to decrease zoloft to 100mg  1 1/2 tabet q day - since she was doing better the last time here.  Let me know either way.

## 2024-01-25 MED ORDER — SERTRALINE HCL 100 MG PO TABS: ORAL_TABLET | ORAL | 1 refills | Status: DC

## 2024-01-25 MED ORDER — FLUTICASONE PROPIONATE HFA 110 MCG/ACT IN AERO: 2.0000 | INHALATION_SPRAY | Freq: Two times a day (BID) | RESPIRATORY_TRACT | 1 refills | Status: DC

## 2024-01-25 NOTE — Telephone Encounter (Signed)
 Rx sent in for zoloft with directions to take 1 1/2 tablet q day. I did send in the flovent. Please confirm breathing doing ok. Any problems?

## 2024-02-13 DIAGNOSIS — M5416 Radiculopathy, lumbar region: Secondary | ICD-10-CM | POA: Diagnosis not present

## 2024-02-13 DIAGNOSIS — M6281 Muscle weakness (generalized): Secondary | ICD-10-CM | POA: Diagnosis not present

## 2024-02-14 DIAGNOSIS — E119 Type 2 diabetes mellitus without complications: Secondary | ICD-10-CM | POA: Diagnosis not present

## 2024-02-25 DIAGNOSIS — M5416 Radiculopathy, lumbar region: Secondary | ICD-10-CM | POA: Diagnosis not present

## 2024-02-25 DIAGNOSIS — M6281 Muscle weakness (generalized): Secondary | ICD-10-CM | POA: Diagnosis not present

## 2024-03-07 DIAGNOSIS — M5416 Radiculopathy, lumbar region: Secondary | ICD-10-CM | POA: Diagnosis not present

## 2024-03-07 DIAGNOSIS — M6281 Muscle weakness (generalized): Secondary | ICD-10-CM | POA: Diagnosis not present

## 2024-03-14 DIAGNOSIS — Z170 Estrogen receptor positive status [ER+]: Secondary | ICD-10-CM | POA: Diagnosis not present

## 2024-03-14 DIAGNOSIS — N644 Mastodynia: Secondary | ICD-10-CM | POA: Diagnosis not present

## 2024-03-14 DIAGNOSIS — C50412 Malignant neoplasm of upper-outer quadrant of left female breast: Secondary | ICD-10-CM | POA: Diagnosis not present

## 2024-03-16 DIAGNOSIS — E119 Type 2 diabetes mellitus without complications: Secondary | ICD-10-CM | POA: Diagnosis not present

## 2024-03-19 DIAGNOSIS — M5416 Radiculopathy, lumbar region: Secondary | ICD-10-CM | POA: Diagnosis not present

## 2024-03-31 DIAGNOSIS — M5416 Radiculopathy, lumbar region: Secondary | ICD-10-CM | POA: Diagnosis not present

## 2024-03-31 DIAGNOSIS — M6281 Muscle weakness (generalized): Secondary | ICD-10-CM | POA: Diagnosis not present

## 2024-04-01 DIAGNOSIS — R92321 Mammographic fibroglandular density, right breast: Secondary | ICD-10-CM | POA: Diagnosis not present

## 2024-04-01 DIAGNOSIS — Z170 Estrogen receptor positive status [ER+]: Secondary | ICD-10-CM | POA: Diagnosis not present

## 2024-04-01 DIAGNOSIS — N644 Mastodynia: Secondary | ICD-10-CM | POA: Diagnosis not present

## 2024-04-01 DIAGNOSIS — C50412 Malignant neoplasm of upper-outer quadrant of left female breast: Secondary | ICD-10-CM | POA: Diagnosis not present

## 2024-04-15 DIAGNOSIS — E119 Type 2 diabetes mellitus without complications: Secondary | ICD-10-CM | POA: Diagnosis not present

## 2024-04-21 ENCOUNTER — Other Ambulatory Visit (INDEPENDENT_AMBULATORY_CARE_PROVIDER_SITE_OTHER)

## 2024-04-21 ENCOUNTER — Ambulatory Visit: Payer: Self-pay | Admitting: Internal Medicine

## 2024-04-21 DIAGNOSIS — E1169 Type 2 diabetes mellitus with other specified complication: Secondary | ICD-10-CM

## 2024-04-21 DIAGNOSIS — E785 Hyperlipidemia, unspecified: Secondary | ICD-10-CM | POA: Diagnosis not present

## 2024-04-21 LAB — BASIC METABOLIC PANEL WITH GFR
BUN: 21 mg/dL (ref 6–23)
CO2: 31 meq/L (ref 19–32)
Calcium: 9.3 mg/dL (ref 8.4–10.5)
Chloride: 103 meq/L (ref 96–112)
Creatinine, Ser: 1.01 mg/dL (ref 0.40–1.20)
GFR: 56.96 mL/min — ABNORMAL LOW (ref >60.00)
Glucose, Bld: 96 mg/dL (ref 70–99)
Potassium: 4.3 meq/L (ref 3.5–5.1)
Sodium: 140 meq/L (ref 135–145)

## 2024-04-21 LAB — HEPATIC FUNCTION PANEL
ALT: 13 U/L (ref 0–35)
AST: 17 U/L (ref 0–37)
Albumin: 4.6 g/dL (ref 3.5–5.2)
Alkaline Phosphatase: 41 U/L (ref 39–117)
Bilirubin, Direct: 0.1 mg/dL (ref 0.0–0.3)
Total Bilirubin: 0.6 mg/dL (ref 0.2–1.2)
Total Protein: 6.8 g/dL (ref 6.0–8.3)

## 2024-04-21 LAB — LIPID PANEL
Cholesterol: 165 mg/dL (ref 0–200)
HDL: 67.1 mg/dL (ref >39.00)
LDL Cholesterol: 75 mg/dL (ref 0–99)
NonHDL: 97.75
Total CHOL/HDL Ratio: 2
Triglycerides: 113 mg/dL (ref 0.0–149.0)
VLDL: 22.6 mg/dL (ref 0.0–40.0)

## 2024-04-21 LAB — HEMOGLOBIN A1C: Hgb A1c MFr Bld: 5.9 % (ref 4.6–6.5)

## 2024-04-23 ENCOUNTER — Ambulatory Visit: Admitting: Internal Medicine

## 2024-04-23 ENCOUNTER — Encounter: Payer: Self-pay | Admitting: Internal Medicine

## 2024-04-23 VITALS — BP 110/68 | HR 62 | Resp 16 | Ht 62.0 in | Wt 133.0 lb

## 2024-04-23 DIAGNOSIS — T466X5A Adverse effect of antihyperlipidemic and antiarteriosclerotic drugs, initial encounter: Secondary | ICD-10-CM

## 2024-04-23 DIAGNOSIS — Z0000 Encounter for general adult medical examination without abnormal findings: Secondary | ICD-10-CM

## 2024-04-23 DIAGNOSIS — D649 Anemia, unspecified: Secondary | ICD-10-CM | POA: Diagnosis not present

## 2024-04-23 DIAGNOSIS — F320 Major depressive disorder, single episode, mild: Secondary | ICD-10-CM

## 2024-04-23 DIAGNOSIS — E1169 Type 2 diabetes mellitus with other specified complication: Secondary | ICD-10-CM

## 2024-04-23 DIAGNOSIS — G720 Drug-induced myopathy: Secondary | ICD-10-CM

## 2024-04-23 DIAGNOSIS — R253 Fasciculation: Secondary | ICD-10-CM

## 2024-04-23 DIAGNOSIS — I700 Atherosclerosis of aorta: Secondary | ICD-10-CM

## 2024-04-23 DIAGNOSIS — D472 Monoclonal gammopathy: Secondary | ICD-10-CM

## 2024-04-23 DIAGNOSIS — E039 Hypothyroidism, unspecified: Secondary | ICD-10-CM

## 2024-04-23 DIAGNOSIS — G4733 Obstructive sleep apnea (adult) (pediatric): Secondary | ICD-10-CM

## 2024-04-23 DIAGNOSIS — I10 Essential (primary) hypertension: Secondary | ICD-10-CM

## 2024-04-23 DIAGNOSIS — E785 Hyperlipidemia, unspecified: Secondary | ICD-10-CM

## 2024-04-23 NOTE — Assessment & Plan Note (Signed)
 CPAP.

## 2024-04-23 NOTE — Assessment & Plan Note (Signed)
 Saw neurology. Discussed today. Discussed EMG and NCS. Will notify me if aggreable.

## 2024-04-23 NOTE — Assessment & Plan Note (Signed)
 Continues on zoloft . Also taking trazodone . Appears to be stable.

## 2024-04-23 NOTE — Assessment & Plan Note (Signed)
 Intolerant to statin medication and zetia .  Now on repatha . Tolerating. Reviewed labs.   Lab Results  Component Value Date   CHOL 165 04/21/2024   HDL 67.10 04/21/2024   LDLCALC 75 04/21/2024   TRIG 113.0 04/21/2024   CHOLHDL 2 04/21/2024

## 2024-04-23 NOTE — Progress Notes (Signed)
 Subjective:    Patient ID: Tonya Greer, female    DOB: [birth year], 68 y.o.   MRN: [id number]  Patient here for  Chief Complaint  Patient presents with   Annual Exam    HPI Here for a physical exam. Had f/u with oncology 04/01/24 - f/u left breast cancer. Remain off anti-estrogen. MGUS - stable. Recommended f/u in 6 months. Had diagnostic mammogram right breast 04/01/24 - evaluation for breast pain. Mammogram Birads I. Recommended mammogram bilateral breasts in 6 months. Saw ortho 01/07/24 - s/p left trochanteric bursa injection. Has been to PT for back pain. Continues on zoloft . Takes trazodone  for sleep. Reports she is doing relatively well. Handling stress. Eating. Reports no bowel issues. No nausea or vomiting. Feels tolerating GLP 1 agonist. Still having muscle twitching. Notices daily. Bilateral legs - knees down. Is planning  to follow up with GI (Dr Star) this year for f/u colonoscopy.    Past Medical History:  Diagnosis Date   Depression    Diabetes mellitus without complication (HCC)    History of carpal tunnel syndrome    Hyperlipidemia    Hypertension    Hypothyroidism    OSA (obstructive sleep apnea)    Past Surgical History:  Procedure Laterality Date   ABDOMINAL HYSTERECTOMY     ovaries left in place   brreast biopsy     FOOT SURGERY     TONSILLECTOMY AND ADENOIDECTOMY     Family History  Problem Relation Age of Onset   Heart disease Mother    Social History   Socioeconomic History   Marital status: Married    Spouse name: Not on file   Number of children: 2   Years of education: Not on file   Highest education level: Associate degree: occupational, Scientist, product/process development, or vocational program  Occupational History   Not on file  Tobacco Use   Smoking status: Never   Smokeless tobacco: Never  Substance and Sexual Activity   Alcohol use: Not on file   Drug use: Never   Sexual activity: Not on file  Other Topics Concern   Not on file  Social History  Narrative   Not on file   Social Drivers of Health   Financial Resource Strain: Low Risk  (11/12/2023)   Overall Financial Resource Strain (CARDIA)    Difficulty of Paying Living Expenses: Not hard at all  Food Insecurity: No Food Insecurity (11/12/2023)   Hunger Vital Sign    Worried About Running Out of Food in the Last Year: Never true    Ran Out of Food in the Last Year: Never true  Transportation Needs: No Transportation Needs (11/12/2023)   PRAPARE - Administrator, Civil Service (Medical): No    Lack of Transportation (Non-Medical): No  Physical Activity: Insufficiently Active (11/12/2023)   Exercise Vital Sign    Days of Exercise per Week: 3 days    Minutes of Exercise per Session: 20 min  Stress: No Stress Concern Present (11/12/2023)   [employer] of Occupational Health - Occupational Stress Questionnaire    Feeling of Stress : Not at all  Social Connections: Moderately Integrated (11/12/2023)   Social Connection and Isolation Panel    Frequency of Communication with Friends and Family: More than three times a week    Frequency of Social Gatherings with Friends and Family: More than three times a week    Attends Religious Services: 1 to 4 times per year    Active Member of Clubs or  Organizations: No    Attends Engineer, structural: Not on file    Marital Status: Married     Review of Systems  Constitutional:  Negative for appetite change and unexpected weight change.  HENT:  Negative for congestion and sinus pressure.   Respiratory:  Negative for cough, chest tightness and shortness of breath.   Cardiovascular:  Negative for chest pain, palpitations and leg swelling.  Gastrointestinal:  Negative for abdominal pain, diarrhea, nausea and vomiting.  Genitourinary:  Negative for difficulty urinating and dysuria.  Musculoskeletal:  Negative for joint swelling and myalgias.       Muscle twitching as outlined.   Skin:  Negative for color change and  rash.  Neurological:  Negative for dizziness and headaches.  Psychiatric/Behavioral:  Negative for agitation and dysphoric mood.        Objective:     BP 110/68   Pulse 62   Resp 16   Ht 5' 2 (1.575 m)   Wt 133 lb (60.3 kg)   SpO2 98%   BMI 24.33 kg/m  Wt Readings from Last 3 Encounters:  04/23/24 133 lb (60.3 kg)  12/21/23 137 lb 12.8 oz (62.5 kg)  11/12/23 137 lb (62.1 kg)    Physical Exam Vitals reviewed.  Constitutional:      General: She is not in acute distress.    Appearance: Normal appearance.  HENT:     Head: Normocephalic and atraumatic.     Right Ear: External ear normal.     Left Ear: External ear normal.     Mouth/Throat:     Pharynx: No oropharyngeal exudate or posterior oropharyngeal erythema.  Eyes:     General: No scleral icterus.       Right eye: No discharge.        Left eye: No discharge.     Conjunctiva/sclera: Conjunctivae normal.  Neck:     Thyroid : No thyromegaly.  Cardiovascular:     Rate and Rhythm: Normal rate and regular rhythm.  Pulmonary:     Effort: No respiratory distress.     Breath sounds: Normal breath sounds. No wheezing.  Abdominal:     General: Bowel sounds are normal.     Palpations: Abdomen is soft.     Tenderness: There is no abdominal tenderness.  Musculoskeletal:        General: No swelling or tenderness.     Cervical back: Neck supple. No tenderness.     Comments: DP pulses palpable and equal bilaterally.   Lymphadenopathy:     Cervical: No cervical adenopathy.  Skin:    Findings: No erythema or rash.  Neurological:     Mental Status: She is alert.  Psychiatric:        Mood and Affect: Mood normal.        Behavior: Behavior normal.         Outpatient Encounter Medications as of 04/23/2024  Medication Sig   Ascorbic Acid (VITAMIN C) 1000 MG tablet Take 1,000 mg by mouth daily.   aspirin 81 MG chewable tablet Chew 1 tablet by mouth daily.   Evolocumab  (REPATHA  SURECLICK) 140 MG/ML SOAJ Inject 140 mg into  the skin every 14 (fourteen) days.   fluticasone  (FLOVENT  HFA) 110 MCG/ACT inhaler Inhale 2 puffs into the lungs in the morning and at bedtime.   levothyroxine  (SYNTHROID ) 100 MCG tablet Take 1 tablet (100 mcg total) by mouth daily.   lisinopril  (ZESTRIL ) 20 MG tablet Take 1 tablet (20 mg total) by mouth daily.  Magnesium  Hydroxide 600 MG CHEW Chew 600 mg by mouth daily.   metFORMIN  (GLUCOPHAGE ) 500 MG tablet Take 1 tablet (500 mg total) by mouth daily.   Multiple Vitamins-Minerals (CENTRUM SILVER PO) Take 1 tablet by mouth daily.   sertraline  (ZOLOFT ) 100 MG tablet Take 1 1/2 tablet q day.   tirzepatide  (MOUNJARO ) 7.5 MG/0.5ML Pen Inject 7.5 mg into the skin once a week.   traZODone  (DESYREL ) 50 MG tablet Take 1 tablet (50 mg total) by mouth at bedtime as needed. for sleep   vitamin B-12 (CYANOCOBALAMIN ) 1000 MCG tablet Take 5,000 mcg by mouth daily.   No facility-administered encounter medications on file as of 04/23/2024.     Lab Results  Component Value Date   WBC 5.4 12/14/2023   HGB 12.6 12/14/2023   HCT 37.8 12/14/2023   PLT 222.0 12/14/2023   GLUCOSE 96 04/21/2024   CHOL 165 04/21/2024   TRIG 113.0 04/21/2024   HDL 67.10 04/21/2024   LDLCALC 75 04/21/2024   ALT 13 04/21/2024   AST 17 04/21/2024   NA 140 04/21/2024   K 4.3 04/21/2024   CL 103 04/21/2024   CREATININE 1.01 04/21/2024   BUN 21 04/21/2024   CO2 31 04/21/2024   TSH 0.98 07/24/2023   HGBA1C 5.9 04/21/2024   MICROALBUR 1.1 12/14/2023       Assessment & Plan:  Health care maintenance Assessment & Plan: Physical today 04/23/24.  PAP of vaginal cuff performed (05/20/21) - negative with negative HPV. Mammogram being followed through oncology - recent right breast mammogram 04/01/24 - birads I. Recommended bilateral screening in 6 months.  Need to obtain colonoscopy report.     DM type 2 with diabetic dyslipidemia (HCC) Assessment & Plan: Continue diet and exercise. A1c just checked 5.9. follow met b and A1c.    Orders: -     Lipid panel; Future -     Hepatic function panel; Future -     Hemoglobin A1c; Future -     Basic metabolic panel with GFR; Future  Anemia, unspecified type -     TSH; Future  Statin myopathy Assessment & Plan: Intolerant to statin medication and zetia .  Now on repatha . Tolerating. Reviewed labs.   Lab Results  Component Value Date   CHOL 165 04/21/2024   HDL 67.10 04/21/2024   LDLCALC 75 04/21/2024   TRIG 113.0 04/21/2024   CHOLHDL 2 04/21/2024      OSA (obstructive sleep apnea) Assessment & Plan: CPAP.   MGUS (monoclonal gammopathy of unknown significance) Assessment & Plan: MGUS - stable. Recommended f/u in 6 months. Had f/u 04/01/24.    Major depressive disorder, single episode, mild (HCC) Assessment & Plan: Continues on zoloft . Also taking trazodone . Appears to be stable.    Hypothyroidism, unspecified type Assessment & Plan: On thyroid  replacement. Follow tsh.    Hypertension, unspecified type Assessment & Plan: Blood pressure doing well on lisinopril  20mg  q day. Follow pressures.  Recent GFR 57. Stable. Follow. No changes in medication today.    Fasciculations Assessment & Plan: Saw neurology. Discussed today. Discussed EMG and NCS. Will notify me if aggreable.    Aortic atherosclerosis (HCC) Assessment & Plan: Continue repatha .       Allena Hamilton, MD

## 2024-04-23 NOTE — Assessment & Plan Note (Signed)
 On thyroid replacement.  Follow tsh.

## 2024-04-23 NOTE — Assessment & Plan Note (Signed)
 Blood pressure doing well on lisinopril  20mg  q day. Follow pressures.  Recent GFR 57. Stable. Follow. No changes in medication today.

## 2024-04-23 NOTE — Assessment & Plan Note (Signed)
 MGUS - stable. Recommended f/u in 6 months. Had f/u 04/01/24.

## 2024-04-23 NOTE — Assessment & Plan Note (Signed)
 Continue diet and exercise. A1c just checked 5.9. follow met b and A1c.

## 2024-04-23 NOTE — Assessment & Plan Note (Signed)
Continue repatha.

## 2024-04-23 NOTE — Assessment & Plan Note (Signed)
 Physical today 04/23/24.  PAP of vaginal cuff performed (05/20/21) - negative with negative HPV. Mammogram being followed through oncology - recent right breast mammogram 04/01/24 - birads I. Recommended bilateral screening in 6 months.  Need to obtain colonoscopy report.

## 2024-05-16 DIAGNOSIS — E119 Type 2 diabetes mellitus without complications: Secondary | ICD-10-CM | POA: Diagnosis not present

## 2024-05-23 ENCOUNTER — Other Ambulatory Visit: Payer: Self-pay | Admitting: Internal Medicine

## 2024-06-16 DIAGNOSIS — E119 Type 2 diabetes mellitus without complications: Secondary | ICD-10-CM | POA: Diagnosis not present

## 2024-06-18 DIAGNOSIS — H2513 Age-related nuclear cataract, bilateral: Secondary | ICD-10-CM | POA: Diagnosis not present

## 2024-06-18 DIAGNOSIS — E119 Type 2 diabetes mellitus without complications: Secondary | ICD-10-CM | POA: Diagnosis not present

## 2024-06-18 LAB — OPHTHALMOLOGY REPORT-SCANNED

## 2024-06-20 ENCOUNTER — Encounter: Payer: Self-pay | Admitting: Sleep Medicine

## 2024-06-20 ENCOUNTER — Ambulatory Visit (INDEPENDENT_AMBULATORY_CARE_PROVIDER_SITE_OTHER): Admitting: Sleep Medicine

## 2024-06-20 VITALS — BP 118/70 | HR 58 | Temp 98.0°F | Ht 62.0 in | Wt 133.0 lb

## 2024-06-20 DIAGNOSIS — I10 Essential (primary) hypertension: Secondary | ICD-10-CM | POA: Diagnosis not present

## 2024-06-20 DIAGNOSIS — G4733 Obstructive sleep apnea (adult) (pediatric): Secondary | ICD-10-CM

## 2024-06-20 NOTE — Progress Notes (Signed)
 Name:Tonya Greer MRN: [id number] DOB: Dec 08, 1955   CHIEF COMPLAINT:  CPAP F/U   HISTORY OF PRESENT ILLNESS: Tonya Greer is a 68 y.o. w/ a h/o OSA, HTN, hypothyroidism and anxiety who presents for CPAP f/u visit. Reports using CPAP therapy every night, which is confirmed by compliance data. She is currently using the Airfit P10 nasal pillow mask, which is comfortable. Reports occasional air leaks due to mouth breathing. Reports feeling more refreshed upon awakening with CPAP therapy.    PAST MEDICAL HISTORY :   has a past medical history of Depression, Diabetes mellitus without complication (HCC), History of carpal tunnel syndrome, Hyperlipidemia, Hypertension, Hypothyroidism, OSA (obstructive sleep apnea), and Sleep apnea.  has a past surgical history that includes Abdominal hysterectomy; Foot surgery; Tonsillectomy and adenoidectomy; brreast biopsy; and Breast surgery (08/2020). Prior to Admission medications   Medication Sig Start Date End Date Taking? Authorizing Provider  Ascorbic Acid (VITAMIN C) 1000 MG tablet Take 1,000 mg by mouth daily.   Yes [provider]  aspirin 81 MG chewable tablet Chew 1 tablet by mouth daily.   Yes [provider]  Evolocumab  (REPATHA  SURECLICK) 140 MG/ML SOAJ Inject 140 mg into the skin every 14 (fourteen) days. 12/21/23  Yes Glendia Shad, MD  fluticasone  (FLOVENT  HFA) 110 MCG/ACT inhaler Inhale 2 puffs into the lungs in the morning and at bedtime. 01/25/24  Yes Glendia Shad, MD  levothyroxine  (SYNTHROID ) 100 MCG tablet TAKE 1 TABLET BY MOUTH EVERY DAY 05/23/24  Yes Glendia Shad, MD  lisinopril  (ZESTRIL ) 20 MG tablet Take 1 tablet (20 mg total) by mouth daily. 12/21/23  Yes Glendia Shad, MD  Magnesium  Hydroxide 600 MG CHEW Chew 600 mg by mouth daily.   Yes [provider]  metFORMIN  (GLUCOPHAGE ) 500 MG tablet Take 1 tablet (500 mg total) by mouth daily. 12/21/23  Yes Glendia Shad, MD  Multiple Vitamins-Minerals  (CENTRUM SILVER PO) Take 1 tablet by mouth daily.   Yes [provider]  sertraline  (ZOLOFT ) 100 MG tablet Take 1 1/2 tablet q day. 01/25/24  Yes Glendia Shad, MD  tirzepatide  (MOUNJARO ) 7.5 MG/0.5ML Pen Inject 7.5 mg into the skin once a week. 12/21/23  Yes Glendia Shad, MD  traZODone  (DESYREL ) 50 MG tablet Take 1 tablet (50 mg total) by mouth at bedtime as needed. for sleep 12/21/23  Yes Glendia Shad, MD  vitamin B-12 (CYANOCOBALAMIN ) 1000 MCG tablet Take 5,000 mcg by mouth daily.   Yes [provider]   Allergies  Allergen Reactions   Hydrocodone Itching   Oxycodone Itching    FAMILY HISTORY:  family history includes Arthritis in her mother; Heart disease in her mother. SOCIAL HISTORY:  reports that she has never smoked. She has never used smokeless tobacco. She reports that she does not use drugs.   Review of Systems:  Gen:  Denies  fever, sweats, chills weight loss  HEENT: Denies blurred vision, double vision, ear pain, eye pain, hearing loss, nose bleeds, sore throat Cardiac:  No dizziness, chest pain or heaviness, chest tightness,edema, No JVD Resp:   No cough, -sputum production, -shortness of breath,-wheezing, -hemoptysis,  Gi: Denies swallowing difficulty, stomach pain, nausea or vomiting, diarrhea, constipation, bowel incontinence Gu:  Denies bladder incontinence, burning urine Ext:   Denies Joint pain, stiffness or swelling Skin: Denies  skin rash, easy bruising or bleeding or hives Endoc:  Denies polyuria, polydipsia , polyphagia or weight change Psych:   Denies depression, insomnia or hallucinations  Other:  All other  systems negative  VITAL SIGNS: BP 118/70   Pulse (!) 58   Temp 98 F (36.7 C)   Ht 5' 2 (1.575 m)   Wt 133 lb (60.3 kg)   SpO2 98%   BMI 24.33 kg/m    Physical Examination:   General Appearance: No distress  EYES PERRLA, EOM intact.   NECK Supple, No JVD Pulmonary: normal breath sounds, No wheezing.   CardiovascularNormal S1,S2.  No m/r/g.   Abdomen: Benign, Soft, non-tender. Skin:   warm, no rashes, no ecchymosis  Extremities: normal, no cyanosis, clubbing. Neuro:without focal findings,  speech normal  PSYCHIATRIC: Mood, affect within normal limits.   ASSESSMENT AND PLAN  OSA Patient is using and benefiting from CPAP therapy. Due to occasional elevated AHI, will change settings to auto 4-10 cm H2O. Discussed the consequences of untreated sleep apnea. Advised not to drive drowsy for safety of patient and others. Will follow up in 1 year.   HTN Stable, on current management. Following with PCP.    Patient  satisfied with Plan of action and management. All questions answered  I spent a total of 42 minutes reviewing chart data, face-to-face evaluation with the patient, counseling and coordination of care as detailed above.    Sanaz Scarlett, M.D.  Sleep Medicine [facility]

## 2024-06-20 NOTE — Patient Instructions (Signed)

## 2024-07-13 ENCOUNTER — Other Ambulatory Visit: Payer: Self-pay | Admitting: Internal Medicine

## 2024-07-16 DIAGNOSIS — E119 Type 2 diabetes mellitus without complications: Secondary | ICD-10-CM | POA: Diagnosis not present

## 2024-08-06 ENCOUNTER — Encounter: Payer: Self-pay | Admitting: Internal Medicine

## 2024-08-06 NOTE — Telephone Encounter (Signed)
 Please call her - It appears (per her note), she is feeling better. Any lip swelling, or tongue swelling?  Any sob?  If any acute symptoms, needs to be seen. Unclear how long reaction will last. Hopefully, if she is feeling better, she is over the worst part. I have had some pts with reactions for 2-3 days. Unclear which vaccine since she had both together.

## 2024-08-06 NOTE — Telephone Encounter (Signed)
 called pt she states she feels better today still itchy but not as much on both feet and hands. she has been taking benadryl to help with her symptoms no headache, no SOB or any acute symptoms she receive her vaccines from local pharmacy where she picks up her medication.   She asked is there something else she can take for the sever scratching so she can have a litlle bit of relief?

## 2024-08-07 ENCOUNTER — Other Ambulatory Visit: Payer: Self-pay | Admitting: Internal Medicine

## 2024-08-16 DIAGNOSIS — E119 Type 2 diabetes mellitus without complications: Secondary | ICD-10-CM | POA: Diagnosis not present

## 2024-08-19 ENCOUNTER — Telehealth: Payer: Self-pay

## 2024-08-19 ENCOUNTER — Other Ambulatory Visit (HOSPITAL_COMMUNITY): Payer: Self-pay

## 2024-08-19 NOTE — Telephone Encounter (Signed)
 Pharmacy Patient Advocate Encounter   Received notification from CoverMyMeds that prior authorization for Repatha  SureClick 140MG /ML auto-injectors  is due for renewal.   Insurance verification completed.   The patient is insured through [facility] MEDICARE.  Action: PA required; PA submitted to above mentioned insurance via Latent Key/confirmation #/EOC [place] Status is pending

## 2024-08-20 NOTE — Telephone Encounter (Signed)
 Copied from CRM [id number]. Topic: Clinical - Medication Prior Auth >> Aug 20, 2024  4:48 PM Roselie BROCKS wrote: Reason for CRM: [facility] , prior authorization denied for medication Evolocumab  (REPATHA  SURECLICK) 140 MG/ML SOAJ Denied due to  Criteria not being met,information will be listed on fax sent to clinic as well.  Return phone [phone] [facility]

## 2024-08-21 NOTE — Telephone Encounter (Signed)
 Pharmacy Patient Advocate Encounter  Received notification from [facility] MEdD that Prior Authorization for Repatha  SureClick 140MG /ML auto-injectors has been DENIED.  See denial reason below. No denial letter attached in CMM. Will attach denial letter to Media tab once received. DENIED. We denied coverage for this drug because Repatha  did not meet Repatha  Prior Authorization Criteria requirements. Prior Authorization requires members to meet certain criteria set by the plan before a drug is covered. Repatha  is approved when prescribed by (or in consultation with) a specialist in cardiology or endocrinology, or by a physician who focuses in the treatment of cardiovascular risk management or lipid disorders. In this case, a specialist has not prescribed Repatha  or been consulted regarding Repatha  therapy for this member.    PA #/Case ID/Reference #: [id number]

## 2024-08-21 NOTE — Telephone Encounter (Signed)
 LVM and sent via my chart to inform pt of Denial

## 2024-08-22 NOTE — Telephone Encounter (Signed)
 FYI-Repatha  denial

## 2024-08-23 NOTE — Telephone Encounter (Signed)
 There is a form in box related to this.

## 2024-08-25 ENCOUNTER — Other Ambulatory Visit

## 2024-08-25 DIAGNOSIS — E1169 Type 2 diabetes mellitus with other specified complication: Secondary | ICD-10-CM

## 2024-08-25 DIAGNOSIS — E785 Hyperlipidemia, unspecified: Secondary | ICD-10-CM | POA: Diagnosis not present

## 2024-08-25 DIAGNOSIS — D649 Anemia, unspecified: Secondary | ICD-10-CM | POA: Diagnosis not present

## 2024-08-25 LAB — HEMOGLOBIN A1C: Hgb A1c MFr Bld: 5.8 % (ref 4.6–6.5)

## 2024-08-25 LAB — LIPID PANEL
Cholesterol: 155 mg/dL (ref 0–200)
HDL: 62.1 mg/dL (ref >39.00)
LDL Cholesterol: 67 mg/dL (ref 0–99)
NonHDL: 92.57
Total CHOL/HDL Ratio: 2
Triglycerides: 127 mg/dL (ref 0.0–149.0)
VLDL: 25.4 mg/dL (ref 0.0–40.0)

## 2024-08-25 LAB — HEPATIC FUNCTION PANEL
ALT: 15 U/L (ref 0–35)
AST: 18 U/L (ref 0–37)
Albumin: 4.3 g/dL (ref 3.5–5.2)
Alkaline Phosphatase: 52 U/L (ref 39–117)
Bilirubin, Direct: 0.1 mg/dL (ref 0.0–0.3)
Total Bilirubin: 0.6 mg/dL (ref 0.2–1.2)
Total Protein: 6.4 g/dL (ref 6.0–8.3)

## 2024-08-25 LAB — BASIC METABOLIC PANEL WITH GFR
BUN: 14 mg/dL (ref 6–23)
CO2: 28 meq/L (ref 19–32)
Calcium: 8.9 mg/dL (ref 8.4–10.5)
Chloride: 100 meq/L (ref 96–112)
Creatinine, Ser: 0.89 mg/dL (ref 0.40–1.20)
GFR: 66.14 mL/min (ref >60.00)
Glucose, Bld: 91 mg/dL (ref 70–99)
Potassium: 4.1 meq/L (ref 3.5–5.1)
Sodium: 137 meq/L (ref 135–145)

## 2024-08-25 LAB — TSH: TSH: 2.33 u[IU]/mL (ref 0.35–5.50)

## 2024-08-26 ENCOUNTER — Ambulatory Visit: Payer: Self-pay | Admitting: Internal Medicine

## 2024-08-27 ENCOUNTER — Encounter: Payer: Self-pay | Admitting: Internal Medicine

## 2024-08-27 ENCOUNTER — Ambulatory Visit: Admitting: Internal Medicine

## 2024-08-27 ENCOUNTER — Other Ambulatory Visit (HOSPITAL_COMMUNITY): Payer: Self-pay

## 2024-08-27 VITALS — BP 120/70 | HR 86 | Temp 98.4°F | Ht 62.0 in | Wt 128.0 lb

## 2024-08-27 DIAGNOSIS — T466X5A Adverse effect of antihyperlipidemic and antiarteriosclerotic drugs, initial encounter: Secondary | ICD-10-CM

## 2024-08-27 DIAGNOSIS — E785 Hyperlipidemia, unspecified: Secondary | ICD-10-CM

## 2024-08-27 DIAGNOSIS — D649 Anemia, unspecified: Secondary | ICD-10-CM | POA: Diagnosis not present

## 2024-08-27 DIAGNOSIS — E039 Hypothyroidism, unspecified: Secondary | ICD-10-CM

## 2024-08-27 DIAGNOSIS — F320 Major depressive disorder, single episode, mild: Secondary | ICD-10-CM

## 2024-08-27 DIAGNOSIS — D472 Monoclonal gammopathy: Secondary | ICD-10-CM

## 2024-08-27 DIAGNOSIS — G4733 Obstructive sleep apnea (adult) (pediatric): Secondary | ICD-10-CM

## 2024-08-27 DIAGNOSIS — E1169 Type 2 diabetes mellitus with other specified complication: Secondary | ICD-10-CM

## 2024-08-27 DIAGNOSIS — G720 Drug-induced myopathy: Secondary | ICD-10-CM | POA: Diagnosis not present

## 2024-08-27 DIAGNOSIS — Z7985 Long-term (current) use of injectable non-insulin antidiabetic drugs: Secondary | ICD-10-CM

## 2024-08-27 DIAGNOSIS — I10 Essential (primary) hypertension: Secondary | ICD-10-CM

## 2024-08-27 LAB — HM DIABETES FOOT EXAM

## 2024-08-27 MED ORDER — TRAZODONE HCL 50 MG PO TABS: 50.0000 mg | ORAL_TABLET | Freq: Every evening | ORAL | 1 refills | Status: DC | PRN

## 2024-08-27 MED ORDER — SERTRALINE HCL 100 MG PO TABS: ORAL_TABLET | ORAL | 1 refills | Status: DC

## 2024-08-27 MED ORDER — LEVOTHYROXINE SODIUM 100 MCG PO TABS: 100.0000 ug | ORAL_TABLET | Freq: Every day | ORAL | 1 refills | Status: DC

## 2024-08-27 MED ORDER — MOUNJARO 7.5 MG/0.5ML ~~LOC~~ SOAJ: 7.5000 mg | SUBCUTANEOUS | 3 refills | Status: DC

## 2024-08-27 NOTE — Assessment & Plan Note (Signed)
 MGUS - stable. Recommended f/u in 6 months. Had f/u 04/01/24. Request cbc check here.

## 2024-08-27 NOTE — Assessment & Plan Note (Signed)
 Continue diet and exercise. Discussed recent labs. Follow met b and A1c.  Lab Results  Component Value Date   HGBA1C 5.8 08/25/2024

## 2024-08-27 NOTE — Telephone Encounter (Signed)
 Noted.  Thank you. Let me know if something more needs to be done.

## 2024-08-27 NOTE — Assessment & Plan Note (Signed)
 Continues on zoloft . Also taking trazodone .  Stable.

## 2024-08-27 NOTE — Assessment & Plan Note (Signed)
 Continue cpap.

## 2024-08-27 NOTE — Assessment & Plan Note (Signed)
 Intolerant to statin medication and zetia .  Now on repatha . Tolerating. Discussed recent labs.  Lab Results  Component Value Date   CHOL 155 08/25/2024   HDL 62.10 08/25/2024   LDLCALC 67 08/25/2024   TRIG 127.0 08/25/2024   CHOLHDL 2 08/25/2024

## 2024-08-27 NOTE — Telephone Encounter (Signed)
 Can PA be resubmitted Physician is treating for cardiovascular risk management as well as lipid disorders.

## 2024-08-27 NOTE — Progress Notes (Signed)
 Subjective:    Patient ID: Tonya Greer, female    DOB: 1956-08-16, 68 y.o.   MRN: [id number]  Patient here for  Chief Complaint  Patient presents with   Medical Management of Chronic Issues    HPI Here for a scheduled follow up - follow up regarding diabetes and hypertension. Had f/u with oncology 04/01/24 - f/u left breast cancer. Remain off anti-estrogen. MGUS - stable. Recommended f/u in 6 months. Had diagnostic mammogram right breast 04/01/24 - evaluation for breast pain. Mammogram Birads I. Recommended mammogram bilateral breasts in 6 months. Saw ortho 01/07/24 - s/p left trochanteric bursa injection. Has been to PT for back pain. Had f/u with pulmonary 06/20/24 - f/u OSA. Using cpap regularly. She reports she is doing well. Feels  good. No chest pain or sob reported. No abdominal pain or bowel change reported. Scheduled colonoscopy later this month. Mammogram scheduled for first of December.    Past Medical History:  Diagnosis Date   Depression    Diabetes mellitus without complication (HCC)    History of carpal tunnel syndrome    Hyperlipidemia    Hypertension    Hypothyroidism    OSA (obstructive sleep apnea)    Sleep apnea    Past Surgical History:  Procedure Laterality Date   ABDOMINAL HYSTERECTOMY     ovaries left in place   BREAST SURGERY  08/2020   Stage 1 breast cancer.   brreast biopsy     FOOT SURGERY     TONSILLECTOMY AND ADENOIDECTOMY     Family History  Problem Relation Age of Onset   Heart disease Mother    Arthritis Mother    Social History   Socioeconomic History   Marital status: Married    Spouse name: Not on file   Number of children: 2   Years of education: Not on file   Highest education level: Associate degree: occupational, scientist, product/process development, or vocational program  Occupational History   Not on file  Tobacco Use   Smoking status: Never   Smokeless tobacco: Never  Substance and Sexual Activity   Alcohol use: Not on file   Drug use: Never    Sexual activity: Not on file  Other Topics Concern   Not on file  Social History Narrative   Not on file   Social Drivers of Health   Financial Resource Strain: Low Risk  (11/12/2023)   Overall Financial Resource Strain (CARDIA)    Difficulty of Paying Living Expenses: Not hard at all  Food Insecurity: No Food Insecurity (11/12/2023)   Hunger Vital Sign    Worried About Running Out of Food in the Last Year: Never true    Ran Out of Food in the Last Year: Never true  Transportation Needs: No Transportation Needs (11/12/2023)   PRAPARE - Administrator, Civil Service (Medical): No    Lack of Transportation (Non-Medical): No  Physical Activity: Insufficiently Active (11/12/2023)   Exercise Vital Sign    Days of Exercise per Week: 3 days    Minutes of Exercise per Session: 20 min  Stress: No Stress Concern Present (11/12/2023)   [employer] of Occupational Health - Occupational Stress Questionnaire    Feeling of Stress : Not at all  Social Connections: Moderately Integrated (11/12/2023)   Social Connection and Isolation Panel    Frequency of Communication with Friends and Family: More than three times a week    Frequency of Social Gatherings with Friends and Family: More than three times  a week    Attends Religious Services: 1 to 4 times per year    Active Member of Clubs or Organizations: No    Attends Engineer, Structural: Not on file    Marital Status: Married     Review of Systems  Constitutional:  Negative for appetite change and unexpected weight change.  HENT:  Negative for congestion and sinus pressure.   Respiratory:  Negative for cough, chest tightness and shortness of breath.   Cardiovascular:  Negative for chest pain and palpitations.  Gastrointestinal:  Negative for abdominal pain, diarrhea, nausea and vomiting.  Genitourinary:  Negative for difficulty urinating and dysuria.  Musculoskeletal:  Negative for joint swelling and myalgias.  Skin:   Negative for color change and rash.  Neurological:  Negative for dizziness and headaches.  Psychiatric/Behavioral:  Negative for agitation and dysphoric mood.        Objective:     BP 120/70   Pulse 86   Temp 98.4 F (36.9 C) (Oral)   Ht 5' 2 (1.575 m)   Wt 128 lb (58.1 kg)   SpO2 97%   BMI 23.41 kg/m  Wt Readings from Last 3 Encounters:  08/27/24 128 lb (58.1 kg)  06/20/24 133 lb (60.3 kg)  04/23/24 133 lb (60.3 kg)    Physical Exam Vitals reviewed.  Constitutional:      General: She is not in acute distress.    Appearance: Normal appearance.  HENT:     Head: Normocephalic and atraumatic.     Right Ear: External ear normal.     Left Ear: External ear normal.  Eyes:     General: No scleral icterus.       Right eye: No discharge.        Left eye: No discharge.     Conjunctiva/sclera: Conjunctivae normal.  Neck:     Thyroid : No thyromegaly.  Cardiovascular:     Rate and Rhythm: Normal rate and regular rhythm.  Pulmonary:     Effort: No respiratory distress.     Breath sounds: Normal breath sounds. No wheezing.  Abdominal:     General: Bowel sounds are normal.     Palpations: Abdomen is soft.     Tenderness: There is no abdominal tenderness.  Musculoskeletal:        General: No swelling or tenderness.     Cervical back: Neck supple. No tenderness.  Lymphadenopathy:     Cervical: No cervical adenopathy.  Skin:    Findings: No erythema or rash.  Neurological:     Mental Status: She is alert.  Psychiatric:        Mood and Affect: Mood normal.        Behavior: Behavior normal.      Diabetic foot exam was performed with the following findings:   No deformities, ulcerations, or other skin breakdown Normal sensation of 10g monofilament Intact posterior tibialis and dorsalis pedis pulses      Outpatient Encounter Medications as of 08/27/2024  Medication Sig   Ascorbic Acid (VITAMIN C) 1000 MG tablet Take 1,000 mg by mouth daily.   aspirin 81 MG  chewable tablet Chew 1 tablet by mouth daily.   Evolocumab  (REPATHA  SURECLICK) 140 MG/ML SOAJ Inject 140 mg into the skin every 14 (fourteen) days.   fluticasone  (FLOVENT  HFA) 110 MCG/ACT inhaler INHALE 2 PUFFS INTO THE LUNGS IN THE MORNING AND AT BEDTIME.   lisinopril  (ZESTRIL ) 20 MG tablet Take 1 tablet (20 mg total) by mouth daily.  Magnesium  Hydroxide 600 MG CHEW Chew 600 mg by mouth daily.   metFORMIN  (GLUCOPHAGE ) 500 MG tablet Take 1 tablet (500 mg total) by mouth daily.   Multiple Vitamins-Minerals (CENTRUM SILVER PO) Take 1 tablet by mouth daily.   vitamin B-12 (CYANOCOBALAMIN ) 1000 MCG tablet Take 5,000 mcg by mouth daily.   [DISCONTINUED] tirzepatide  (MOUNJARO ) 7.5 MG/0.5ML Pen Inject 7.5 mg into the skin once a week.   levothyroxine  (SYNTHROID ) 100 MCG tablet Take 1 tablet (100 mcg total) by mouth daily.   sertraline  (ZOLOFT ) 100 MG tablet Take 1 1/2 tablet q day.   tirzepatide  (MOUNJARO ) 7.5 MG/0.5ML Pen Inject 7.5 mg into the skin once a week.   traZODone  (DESYREL ) 50 MG tablet Take 1 tablet (50 mg total) by mouth at bedtime as needed. for sleep   [DISCONTINUED] levothyroxine  (SYNTHROID ) 100 MCG tablet TAKE 1 TABLET BY MOUTH EVERY DAY   [DISCONTINUED] sertraline  (ZOLOFT ) 100 MG tablet Take 1 1/2 tablet q day.   [DISCONTINUED] traZODone  (DESYREL ) 50 MG tablet TAKE 1 TABLET BY MOUTH AT BEDTIME AS NEEDED FOR SLEEP   No facility-administered encounter medications on file as of 08/27/2024.     Lab Results  Component Value Date   WBC 5.4 12/14/2023   HGB 12.6 12/14/2023   HCT 37.8 12/14/2023   PLT 222.0 12/14/2023   GLUCOSE 91 08/25/2024   CHOL 155 08/25/2024   TRIG 127.0 08/25/2024   HDL 62.10 08/25/2024   LDLCALC 67 08/25/2024   ALT 15 08/25/2024   AST 18 08/25/2024   NA 137 08/25/2024   K 4.1 08/25/2024   CL 100 08/25/2024   CREATININE 0.89 08/25/2024   BUN 14 08/25/2024   CO2 28 08/25/2024   TSH 2.33 08/25/2024   HGBA1C 5.8 08/25/2024   MICROALBUR 1.1 12/14/2023        Assessment & Plan:  MGUS (monoclonal gammopathy of unknown significance) Assessment & Plan: MGUS - stable. Recommended f/u in 6 months. Had f/u 04/01/24. Request cbc check here.   Orders: -     CBC with Differential/Platelet; Future -     CBC with Differential/Platelet; Future  Anemia, unspecified type  DM type 2 with diabetic dyslipidemia (HCC) Assessment & Plan: Continue diet and exercise. Discussed recent labs. Follow met b and A1c.  Lab Results  Component Value Date   HGBA1C 5.8 08/25/2024     Orders: -     Basic metabolic panel with GFR; Future -     Hemoglobin A1c; Future -     Hepatic function panel; Future -     Lipid panel; Future  Statin myopathy Assessment & Plan: Intolerant to statin medication and zetia .  Now on repatha . Tolerating. Discussed recent labs.  Lab Results  Component Value Date   CHOL 155 08/25/2024   HDL 62.10 08/25/2024   LDLCALC 67 08/25/2024   TRIG 127.0 08/25/2024   CHOLHDL 2 08/25/2024      OSA (obstructive sleep apnea) Assessment & Plan: Continue cpap.    Major depressive disorder, single episode, mild Assessment & Plan: Continues on zoloft . Also taking trazodone .  Stable.    Hypothyroidism, unspecified type Assessment & Plan: On thyroid  replacement. Follow tsh.    Hypertension, unspecified type Assessment & Plan: Blood pressure doing well on lisinopril  20mg  q day. Follow pressures.  Recent GFR improved 66. Follow.  No change in medication today.    Other orders -     Levothyroxine  Sodium; Take 1 tablet (100 mcg total) by mouth daily.  Dispense: 90 tablet; Refill:  1 -     Sertraline  HCl; Take 1 1/2 tablet q day.  Dispense: 135 tablet; Refill: 1 -     traZODone  HCl; Take 1 tablet (50 mg total) by mouth at bedtime as needed. for sleep  Dispense: 90 tablet; Refill: 1 -     Mounjaro ; Inject 7.5 mg into the skin once a week.  Dispense: 6 mL; Refill: 3     Allena Hamilton, MD

## 2024-08-27 NOTE — Assessment & Plan Note (Signed)
 On thyroid replacement.  Follow tsh.

## 2024-08-27 NOTE — Assessment & Plan Note (Signed)
 Blood pressure doing well on lisinopril  20mg  q day. Follow pressures.  Recent GFR improved 66. Follow.  No change in medication today.

## 2024-08-29 ENCOUNTER — Telehealth: Payer: Self-pay | Admitting: Pharmacist

## 2024-08-29 NOTE — Telephone Encounter (Signed)
 Appeal has been submitted. Will advise when response is received or follow up in 1 week. Please be advised that most companies may take 30 days to make a decision. Appeal letter and supporting documentation have been faxed to [phone] on 08/29/2024 @10 :44 am.  Thank you, Devere Pandy, PharmD Clinical Pharmacist  [facility]  Direct Dial: [phone]

## 2024-09-01 ENCOUNTER — Encounter: Payer: Self-pay | Admitting: Internal Medicine

## 2024-09-01 ENCOUNTER — Other Ambulatory Visit

## 2024-09-01 DIAGNOSIS — D472 Monoclonal gammopathy: Secondary | ICD-10-CM | POA: Diagnosis not present

## 2024-09-01 LAB — CBC WITH DIFFERENTIAL/PLATELET
Basophils Absolute: 0 K/uL (ref 0.0–0.1)
Basophils Relative: 1 % (ref 0.0–3.0)
Eosinophils Absolute: 0.2 K/uL (ref 0.0–0.7)
Eosinophils Relative: 3.7 % (ref 0.0–5.0)
HCT: 37.5 % (ref 36.0–46.0)
Hemoglobin: 12.7 g/dL (ref 12.0–15.0)
Lymphocytes Relative: 30.4 % (ref 12.0–46.0)
Lymphs Abs: 1.4 K/uL (ref 0.7–4.0)
MCHC: 33.8 g/dL (ref 30.0–36.0)
MCV: 93.3 fl (ref 78.0–100.0)
Monocytes Absolute: 0.3 K/uL (ref 0.1–1.0)
Monocytes Relative: 6.5 % (ref 3.0–12.0)
Neutro Abs: 2.6 K/uL (ref 1.4–7.7)
Neutrophils Relative %: 58.4 % (ref 43.0–77.0)
Platelets: 146 K/uL — ABNORMAL LOW (ref 150.0–400.0)
RBC: 4.02 Mil/uL (ref 3.87–5.11)
RDW: 14.4 % (ref 11.5–15.5)
WBC: 4.5 K/uL (ref 4.0–10.5)

## 2024-09-01 MED ORDER — REPATHA SURECLICK 140 MG/ML ~~LOC~~ SOAJ: 140.0000 mg | SUBCUTANEOUS | 1 refills | Status: DC

## 2024-09-01 NOTE — Telephone Encounter (Signed)
 Insurance has approved the appeal for Repatha  through 08/29/2025.  Full letter can be found under the media tab.  Thank you, Devere Pandy, PharmD Clinical Pharmacist  [facility]  Direct Dial: [phone]

## 2024-09-02 ENCOUNTER — Ambulatory Visit: Payer: Self-pay | Admitting: Internal Medicine

## 2024-09-09 DIAGNOSIS — Z1211 Encounter for screening for malignant neoplasm of colon: Secondary | ICD-10-CM | POA: Diagnosis not present

## 2024-09-09 DIAGNOSIS — Z860100 Personal history of colon polyps, unspecified: Secondary | ICD-10-CM | POA: Diagnosis not present

## 2024-09-17 DIAGNOSIS — Z1231 Encounter for screening mammogram for malignant neoplasm of breast: Secondary | ICD-10-CM | POA: Diagnosis not present

## 2024-09-17 DIAGNOSIS — C50412 Malignant neoplasm of upper-outer quadrant of left female breast: Secondary | ICD-10-CM | POA: Diagnosis not present

## 2024-09-17 DIAGNOSIS — Z853 Personal history of malignant neoplasm of breast: Secondary | ICD-10-CM | POA: Diagnosis not present

## 2024-09-17 DIAGNOSIS — R928 Other abnormal and inconclusive findings on diagnostic imaging of breast: Secondary | ICD-10-CM | POA: Diagnosis not present

## 2024-09-17 DIAGNOSIS — Z170 Estrogen receptor positive status [ER+]: Secondary | ICD-10-CM | POA: Diagnosis not present

## 2024-09-17 DIAGNOSIS — Z9889 Other specified postprocedural states: Secondary | ICD-10-CM | POA: Diagnosis not present

## 2024-09-17 DIAGNOSIS — C50912 Malignant neoplasm of unspecified site of left female breast: Secondary | ICD-10-CM | POA: Diagnosis not present

## 2024-09-25 NOTE — Progress Notes (Signed)
 Tonya Greer                                          MRN: [id number]   09/25/2024   The [facility] Quality Team Specialist reviewed this patient medical record for the purposes of chart review for care gap closure. The following were reviewed: abstraction for care gap closure-kidney health evaluation for diabetes:eGFR  and uACR.    [facility]I Quality Team

## 2024-10-07 ENCOUNTER — Other Ambulatory Visit: Payer: Self-pay | Admitting: Internal Medicine

## 2024-10-13 ENCOUNTER — Other Ambulatory Visit (HOSPITAL_COMMUNITY): Payer: Self-pay

## 2024-10-13 ENCOUNTER — Telehealth: Payer: Self-pay

## 2024-10-13 NOTE — Telephone Encounter (Signed)
 Pharmacy Patient Advocate Encounter   Received notification from Onbase that prior authorization for MOUNJARO  is required/requested.   Insurance verification completed.   The patient is insured through [facility].   Per test claim: Refill too soon. PA is not needed at this time. Medication was filled 09/09/24. Next eligible fill date is 12/02/24.

## 2024-10-22 ENCOUNTER — Other Ambulatory Visit: Payer: Self-pay

## 2024-10-22 MED ORDER — MOUNJARO 7.5 MG/0.5ML ~~LOC~~ SOAJ: 7.5000 mg | SUBCUTANEOUS | 3 refills | Status: AC

## 2024-10-22 MED ORDER — LISINOPRIL 20 MG PO TABS: 20.0000 mg | ORAL_TABLET | Freq: Every day | ORAL | 1 refills | Status: AC

## 2024-10-22 MED ORDER — REPATHA SURECLICK 140 MG/ML ~~LOC~~ SOAJ: 140.0000 mg | SUBCUTANEOUS | 1 refills | Status: AC

## 2024-10-22 MED ORDER — SERTRALINE HCL 100 MG PO TABS: ORAL_TABLET | ORAL | 1 refills | Status: AC

## 2024-10-22 MED ORDER — LEVOTHYROXINE SODIUM 100 MCG PO TABS: 100.0000 ug | ORAL_TABLET | Freq: Every day | ORAL | 1 refills | Status: AC

## 2024-10-22 MED ORDER — TRAZODONE HCL 50 MG PO TABS: 50.0000 mg | ORAL_TABLET | Freq: Every evening | ORAL | 1 refills | Status: AC | PRN

## 2024-10-22 MED ORDER — METFORMIN HCL 500 MG PO TABS: 500.0000 mg | ORAL_TABLET | Freq: Every day | ORAL | 1 refills | Status: AC

## 2024-10-22 NOTE — Telephone Encounter (Signed)
 Pt came into office with husband. Prescription needed to be sent to [facility] due to insurance changes. All maintenance meds have been sent

## 2024-10-28 ENCOUNTER — Telehealth: Payer: Self-pay

## 2024-10-28 ENCOUNTER — Other Ambulatory Visit (HOSPITAL_COMMUNITY): Payer: Self-pay

## 2024-10-28 NOTE — Telephone Encounter (Signed)
 Pharmacy Patient Advocate Encounter   Received notification from Onbase CMM KEY that prior authorization for Evolocumab  (REPATHA  SURECLICK) 140 MG/ML SOAJ  is required/requested.   Insurance verification completed.   The patient is insured through [employer].   Per test claim: PA required; PA submitted to above mentioned insurance via Latent Key/confirmation #/[id number] Status is pending

## 2024-10-30 NOTE — Telephone Encounter (Signed)
 noted

## 2024-10-30 NOTE — Telephone Encounter (Signed)
 Pharmacy Patient Advocate Encounter  Received notification from [employer] that Prior Authorization for (REPATHA  SURECLICK) 140 MG/ML SOAJ  has been APPROVED from 10/16/2024 to 10/15/2025   PA #/Case ID/Reference #: PA Case: [id number], Status: Approved, Coverage Starts on: 10/16/2024 12:00:00 AM, Coverage Ends on: 10/15/2025 12:00:00 AM.

## 2024-11-08 ENCOUNTER — Other Ambulatory Visit: Payer: Self-pay | Admitting: Internal Medicine

## 2024-11-18 ENCOUNTER — Ambulatory Visit: Payer: Medicare Other

## 2024-11-20 ENCOUNTER — Encounter: Payer: Self-pay | Admitting: Internal Medicine

## 2024-11-21 NOTE — Telephone Encounter (Signed)
 She has an upcoming appt in 12/2024. Would like to reevaluate and discuss at appt. If needs/wants to schedule earlier appt - ok.

## 2024-12-23 ENCOUNTER — Other Ambulatory Visit

## 2024-12-25 ENCOUNTER — Ambulatory Visit: Admitting: Internal Medicine
